# Patient Record
Sex: Female | Born: 1995 | Hispanic: Yes | Marital: Married | Smoking: Never smoker
Health system: Southern US, Community
[De-identification: ages and names within clinical notes are randomized; demographics above are authoritative.]

## PROBLEM LIST (undated history)

## (undated) DIAGNOSIS — B2790 Infectious mononucleosis, unspecified without complication: Secondary | ICD-10-CM

## (undated) DIAGNOSIS — L309 Dermatitis, unspecified: Secondary | ICD-10-CM

## (undated) DIAGNOSIS — R21 Rash and other nonspecific skin eruption: Secondary | ICD-10-CM

## (undated) DIAGNOSIS — E282 Polycystic ovarian syndrome: Secondary | ICD-10-CM

## (undated) HISTORY — DX: Rash and other nonspecific skin eruption: R21

## (undated) HISTORY — DX: Infectious mononucleosis, unspecified without complication: B27.90

## (undated) HISTORY — DX: Dermatitis, unspecified: L30.9

## (undated) HISTORY — PX: WISDOM TOOTH EXTRACTION: SHX21

## (undated) HISTORY — DX: Polycystic ovarian syndrome: E28.2

---
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
## 2014-01-31 ENCOUNTER — Encounter: Payer: Self-pay | Admitting: Pediatrics

## 2014-01-31 ENCOUNTER — Ambulatory Visit (INDEPENDENT_AMBULATORY_CARE_PROVIDER_SITE_OTHER): Payer: Medicaid Other | Admitting: Pediatrics

## 2014-01-31 VITALS — BP 128/72 | Ht 59.61 in | Wt 197.8 lb

## 2014-01-31 DIAGNOSIS — Z113 Encounter for screening for infections with a predominantly sexual mode of transmission: Secondary | ICD-10-CM

## 2014-01-31 DIAGNOSIS — E282 Polycystic ovarian syndrome: Secondary | ICD-10-CM

## 2014-01-31 MED ORDER — NORETHINDRON-ETHINYL ESTRAD-FE 1-20/1-30/1-35 MG-MCG PO TABS: ORAL_TABLET | ORAL | Status: DC

## 2014-01-31 NOTE — Progress Notes (Signed)
Adolescent Medicine Consultation Follow-Up Visit Cynthia Waller  is a 18 y.o. female here today for follow-up of PCOS.   PCP Confirmed? Yes, Delorse LekMartha Perry, MD.   History was provided by the patient and mother.  Chart review:  Last seen by Dr. Marina GoodellPerry on 11/01/2013.  Treatment plan at last visit included an increase in metformin to 1000 mg po daily, she was started on spironolactone, and OCP (continuous cycling), and thyroid ultrasound was obtained notable for small cyst.   HPI:  Pt reports that she takes metformin "on and off".   She was on it for a month, but then endorsed stopping for one month, she reports that she has trouble remembering to take it.  She is also taking an OCP on a continuous cycle, and endorses taking this daily (however missed yesterday).  She did not start the Spironolactone.    Since las visit, she is now home for the summer, a typical day will include about 2 bagels for breakfast, rice, chicken and 4 tortillas for lunch, and the same for dinner.  As a family, they eat fast food ~1x/week ([employer]).  She drinks mostly water and sodas on the weekends.  She is not doing any physical activity.     She reports one of her major goals in management of her PCOS is weight loss.   She was concerned about hair growth in the beard distribution before, but reports this has improved and is not longer much concern for her.  She is not experiencing any acne.    Last A1C was 5.4 in 07/12/2013.    Patient's last menstrual period was 12/09/2013.  Lasted for 3 days and was heavy flow.   ROS  Constitutional. No fever. Eyes. Denies blurry vision or double vision.  HEENT. HA when outdoors resolve spontaneously or with sleep, currently experiencing some URI symptoms GI. No abdominal pain, no constipation.  GU. No dysuria, no hematuria  Neuro. No dizziness or weakness Endo. No heat or cold intolerance  Skin. No changes     Current Outpatient Prescriptions on File Prior to Visit   Medication Sig Dispense Refill  . cetirizine (ZYRTEC) 10 MG tablet Take 1 tablet (10 mg total) by mouth daily.  30 tablet  0  . metFORMIN (GLUCOPHAGE-XR) 500 MG 24 hr tablet Take 1 pill with dinner x 2 weeks, then 2 pills with dinner, then keep at 2 pills until f/u  60 tablet  2  . norethindrone-ethinyl estradiol-iron (ESTROSTEP FE,TILIA FE,TRI-LEGEST FE) 1-20/1-30/1-35 MG-MCG tablet Take 1 pill daily x 84 days without taking placebo.  Then take placebo with the last pack  4 Package  3  . spironolactone (ALDACTONE) 100 MG tablet Take 1/2 tablet po daily x 2 weeks, then 1 tablet po daily  30 tablet  2   No current facility-administered medications on file prior to visit.    No Known Allergies  Patient Active Problem List   Diagnosis Date Noted  . Goiter 11/01/2013  . Rash and nonspecific skin eruption 06/07/2013  . Polycystic ovarian syndrome 01/22/2013  . Morbid obesity 01/22/2013  . Acanthosis nigricans 01/22/2013    Social History: Sleep:  No concerns, sleeps 8 hours a night Eating Habits: as above.  Screen Time: >8 hrs a day.  Exercise: none. 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  She was living on campus.  She will do some summer classes this year at [employer].  She works at [employer].  Physical Exam:  Filed Vitals:   01/31/14 1550  BP: 128/72  Height: 4' 11.61" (1.514 m)  Weight: 197 lb 12.8 oz (89.721 kg)   BP 128/72  Ht 4' 11.61" (1.514 m)  Wt 197 lb 12.8 oz (89.721 kg)  BMI 39.14 kg/m2  LMP 12/09/2013 Body mass index: body mass index is 39.14 kg/(m^2). Blood pressure percentiles are 97% systolic and 77% diastolic based on 2000 NHANES data. Blood pressure percentile targets: 90: 121/78, 95: 125/82, 99: 137/94.  Physical Examination: General appearance - alert, pleasant, and in no distress Eyes - pupils equal and reactive, extraocular eye movements intact Nose - normal and patent, no  erythema, discharge or polyps Mouth - mucous membranes moist, pharynx normal without lesions Neck - supple, minimal anterior cervical LAN, mild thyromegaly.  Chest - clear to auscultation, no wheezes, rales or rhonchi Heart - RRR, nml S1S2, no murmur appreciated.  Abdomen - soft, normoactive bowel sounds Neurological - alert and oriented, no gross deficits  Extremities - warm and well perfused, no edema   Skin - acanthosis nigricans neck.    Thyroid Ultrasound IMPRESSION:  Normal-sized thyroid with a single small left cyst or nodule.  Findings do not meet current consensus criteria for biopsy.  Follow-up by clinical exam is recommended. If patient has known risk  factors for thyroid carcinoma, consider follow-up ultrasound in 12  months. If patient is clinically hyperthyroid, consider nuclear  medicine thyroid uptake and scan. This recommendation follows the  consensus statement: Management of Thyroid Nodules Detected as US:  Society of Radiologists in Ultrasound Consensus Conference  Statement. Radiology 2005; [id number].   Assessment:  Cynthia Waller is a an 18 year old female with PCOS here for follow up.  Discussed goals of weight loss and interventions to help make this goal possible, encouraged pt to start taking metformin regularly as well as work on attainable goals for more healthy lifestyle.    Plan: -pt to work on goal of 2 servings of fruit a day.  She will email in one month with progress, and will choose new goal at that time.  -reviewed thyroid US with patient. -STD screening: GC/Chlamydia urine.  -Norethindrone-ethinyl estradiol-iron (ESTROSTEP FE,TILIA FE,TRI-LEGEST FE) 1-20/1-30/1-35 MG-MCG tablet; Take 1 pill daily x 84 days without taking placebo.  Then take placebo with the last pack  Dispense: 4 Package; Refill: 3   Follow-up:  3 months   Medical decision-making:  > 25 minutes spent, more than 50% of appointment was spent discussing diagnosis and management of  symptoms  Keith RakeAshley Orval Dortch, MD [facility], PGY-2 01/31/2014 5:08 PM

## 2014-01-31 NOTE — Patient Instructions (Addendum)
Set a phone alarm to take the metformin.    Get a pill box with days of the week.  Also get an extra pill container to put in your purse to take when you're out.   Your goal is to eat 2 servings of fruit a day.   Email in one month to let us know the progress of your fruit intake goal, then we can come up with a new goal.

## 2014-02-01 LAB — GC/CHLAMYDIA PROBE AMP, URINE
Chlamydia, Swab/Urine, PCR: NEGATIVE
GC PROBE AMP, URINE: NEGATIVE

## 2014-02-04 NOTE — Progress Notes (Signed)
Attending Physician Co-Signature  I saw and evaluated the patient, performing the key elements of the service.  I developed  the management plan that is described in the resident's note, and I agree with the content.  Of note, patient is on estrostep which has varying hormone doses.  If she has any BTB or issues with that I recommend switching to a monophasic OCP.  Cain SievePERRY, MARTHA FAIRBANKS, MD

## 2014-05-02 ENCOUNTER — Ambulatory Visit (INDEPENDENT_AMBULATORY_CARE_PROVIDER_SITE_OTHER): Payer: Medicaid Other | Admitting: Pediatrics

## 2014-05-02 ENCOUNTER — Encounter: Payer: Self-pay | Admitting: Pediatrics

## 2014-05-02 VITALS — BP 120/78 | Wt 197.0 lb

## 2014-05-02 DIAGNOSIS — L83 Acanthosis nigricans: Secondary | ICD-10-CM

## 2014-05-02 DIAGNOSIS — R6889 Other general symptoms and signs: Secondary | ICD-10-CM

## 2014-05-02 DIAGNOSIS — E049 Nontoxic goiter, unspecified: Secondary | ICD-10-CM

## 2014-05-02 DIAGNOSIS — E282 Polycystic ovarian syndrome: Secondary | ICD-10-CM

## 2014-05-02 LAB — TSH: TSH: 2.41 u[IU]/mL (ref 0.350–4.500)

## 2014-05-02 LAB — T4, FREE: Free T4: 1.02 ng/dL (ref 0.80–1.80)

## 2014-05-02 MED ORDER — NORGESTIMATE-ETH ESTRADIOL 0.25-35 MG-MCG PO TABS: ORAL_TABLET | ORAL | Status: DC

## 2014-05-02 NOTE — Progress Notes (Signed)
Adolescent Medicine Consultation Follow-Up Visit Cynthia Waller  is a 18 y.o. female here today for follow-up of PCOS.   PCP Confirmed?  yes  PERRY, Bosie Clos, MD   History was provided by the patient.  Chart review:  Last seen by Dr. Marina Goodell on 01/31/14.  Treatment plan at last visit included working on increasing fruits, reviewed thyroid ultrasound results, discussed restarting metformin and continue the OCP.   Last STI screen:  Component     Latest Ref Rng 01/31/2014  Chlamydia, Swab/Urine, PCR     NEGATIVE NEGATIVE  GC Probe Amp, Urine     NEGATIVE NEGATIVE   Pertinent Labs: None Previous Pysch Screenings: None Immunizations: UTD  Psych Screenings completed for today's visit: None  HPI:  Pt reports she has increased her fruit intake.  Also stopped going back for seconds.  Has started exercising as well, was doing 20 minutes of walking and recently started running with a trainer, sometimes lifts weight, once this week, last week was daily.  Has not restarted metformin.  Has had some menstrual irregularity with continuous cycling.    Hair growth: None Acne: None Acanthosis nigricans: Still present unchanged Weight management:  Wt Readings from Last 3 Encounters:  05/02/14 197 lb (89.359 kg) (97%*, Z = 1.91)  01/31/14 197 lb 12.8 oz (89.721 kg) (97%*, Z = 1.93)  11/01/13 198 lb (89.812 kg) (97%*, Z = 1.94)   * Growth percentiles are based on CDC 2-20 Years data.   Patient's last menstrual period was 04/15/2014.  ROS per HPI  The following portions of the patient's history were reviewed and updated as appropriate: allergies, current medications and problem list.  No Known Allergies  Social History: Confidentiality was discussed with the patient and if applicable, with caregiver as well.  Tobacco? no Secondhand smoke exposure?no Drugs/EtOH?no Sexually active?yes, same partner Pregnancy Prevention: OCP Safe at home, in school & in relationships?  Yes Safe to self? Yes Guns in the home? no  Physical Exam:  Filed Vitals:   05/02/14 1538  BP: 120/78  Weight: 197 lb (89.359 kg)   BP 120/78  Wt 197 lb (89.359 kg)  LMP 04/15/2014 Body mass index: body mass index is 38.98 kg/(m^2). No height on file for this encounter.  Physical Exam  Constitutional: No distress.  Neck: Thyromegaly present.  Cardiovascular: Normal rate and regular rhythm.   No murmur heard. Pulmonary/Chest: Breath sounds normal.  Abdominal: Soft. There is no tenderness. There is no guarding.  Lymphadenopathy:    She has no cervical adenopathy.  Skin:  Acanthosis nigricans    Assessment/Plan: 1. Polycystic ovarian syndrome Her symptoms consist of menstrual irregularity with comorbidities of acanthosis nigricans and obesity.  Continuous cycling with estrostep has produced some BTB and thus will switch to monophasic for better results. - norgestimate-ethinyl estradiol (SPRINTEC 28) 0.25-35 MG-MCG tablet; Take 1 tablet daily for first 3 rows of pills, skip placebos.  Repeat until 4th pack of pills and then take placebos at the  Dispense: 4 Package; Refill: 3 PCOS Labs & Referrals:   - CMP annually:  Due 06/2014 - CBC annually if normal, as needed if abnormal:  Due 06/2014 - Vit D once and then as needed if abnormal:  Due 06/2014 - Lipid annually if abnormal, every 2 years if normal:  Due 06/2014 - Hgba1c every 3 months if abnormal, every year if normal:  Due 06/2014 - Nutrition referral: Has seen her in the past and does not feel it will help to go back -  Will also obtain TSH/FT4 with blood tests 06/2014  2. Acanthosis nigricans 3. Morbid obesity Her weight is essentially stable.  She has recently increased her exercise.  She declines further nutritional counseling.  She does not want to take metformin at this time. Advised we will recheck her hgba1c soon and if that shows prediabetes or diabetes we will want to reconsider metformin.  Discussed also the AN is  a sign that there is insulin resistance. Reviewed strategies for healthy eating and encouraged patient to continue exercising.  4. Goiter 5. Cold intolerance Her thyroid ultrasound showed 1 nodule.  Her goiter continues to be impressive.  Will check TFTs and antibody today.  Consider repeat ultrasound by or before 12/2013 and referral to adult endo. - TSH - T4, free - Thyroid peroxidase antibody  Follow-up:  3 months  Medical decision-making:  > 25 minutes spent, more than 50% of appointment was spent discussing diagnosis and management of symptoms

## 2014-05-03 LAB — THYROID PEROXIDASE ANTIBODY: Thyroperoxidase Ab SerPl-aCnc: 2 IU/mL (ref <9)

## 2014-05-07 NOTE — Progress Notes (Signed)
Quick Note:  Please notify patient/caregiver that the recent lab results were normal. We can discuss the results further at future follow-up visits. Please remind patient of any upcoming appointments.  ______ 

## 2014-05-09 ENCOUNTER — Telehealth: Payer: Self-pay

## 2014-05-09 NOTE — Telephone Encounter (Signed)
Message copied by Ovidio Hanger on Fri May 09, 2014  2:16 PM ------      Message from: Owens Shark      Created: Wed May 07, 2014 11:13 AM       Please notify patient/caregiver that the recent lab results were normal.  We can discuss the results further at future follow-up visits.  Please remind patient of any upcoming appointments.       ------

## 2014-05-09 NOTE — Progress Notes (Signed)
Done

## 2014-05-09 NOTE — Telephone Encounter (Signed)
Called and advised mom of normal labs.  She verbalized understanding.  She did question if cholesterol was checked and I told her that it was not.

## 2014-08-01 ENCOUNTER — Ambulatory Visit: Payer: Medicaid Other | Admitting: Pediatrics

## 2014-09-19 ENCOUNTER — Ambulatory Visit (INDEPENDENT_AMBULATORY_CARE_PROVIDER_SITE_OTHER): Payer: Medicaid Other | Admitting: Pediatrics

## 2014-09-19 ENCOUNTER — Encounter: Payer: Self-pay | Admitting: Pediatrics

## 2014-09-19 VITALS — BP 126/74 | Ht 59.5 in | Wt 192.8 lb

## 2014-09-19 DIAGNOSIS — Z23 Encounter for immunization: Secondary | ICD-10-CM

## 2014-09-19 DIAGNOSIS — E282 Polycystic ovarian syndrome: Secondary | ICD-10-CM

## 2014-09-19 DIAGNOSIS — E049 Nontoxic goiter, unspecified: Secondary | ICD-10-CM

## 2014-09-19 MED ORDER — NORGESTIMATE-ETH ESTRADIOL 0.25-35 MG-MCG PO TABS: ORAL_TABLET | ORAL | Status: DC

## 2014-09-19 NOTE — Progress Notes (Signed)
Attending Co-Signature.  I saw and evaluated the patient, performing the key elements of the service.  I developed the management plan that is described in the resident's note, and I agree with the content.  19 yo female here for f/u on PCOS.  Made some positive lifestyle changes but then has decreased in following those recently.  No longer having BTB, improved on Sprintec.  No acne.  No change in hair growth, may be somewhat improved.  Due for labs today.    Wt Readings from Last 3 Encounters:  09/19/14 192 lb 12.8 oz (87.454 kg) (97 %*, Z = 1.84)  05/02/14 197 lb (89.359 kg) (97 %*, Z = 1.91)  01/31/14 197 lb 12.8 oz (89.721 kg) (97 %*, Z = 1.93)   * Growth percentiles are based on CDC 2-20 Years data.    Cain SievePERRY, Azrael Maddix FAIRBANKS, MD Adolescent Medicine Specialist

## 2014-09-19 NOTE — Progress Notes (Signed)
Adolescent Medicine Consultation Follow-Up Visit Cynthia Waller  is a 19 y.o. female here today for follow-up of PCOS.   PCP Confirmed?  yes  Hacker,Caroline T, FNP   History was provided by the patient.  Chart review:  Last seen by Dr. Marina GoodellPerry on 05/02/14.  Treatment plan at last visit included changing from estrostep to Sprintec (monophasic OCP) for concerns of BTB, continue regular excercise, review TFTs (normal), obtain thyroid US in 12/2013 and refer to adult endo if indicated.     Last STI screen:  Component     Latest Ref Rng 01/31/2014  Chlamydia, Swab/Urine, PCR     NEGATIVE NEGATIVE  GC Probe Amp, Urine     NEGATIVE NEGATIVE   Pertinent Labs: TFTs normal 05/02/2014 - TSH 2.4, fT4 1.02, Thyroid peroxidase antibody 2 Previous Pysch Screenings: None Immunizations: UTD  Psych Screenings completed for today's visit: None  HPI:  Pt reports that she was exercising twice weekly with friend (jogging/walking 5 minutes, toning exercises for ~40 minutes), avoiding pizza (previously staple of her diet), eating more fruit. Positive changes stopped in December and pt no longer exercises. She eats mostly sandwiches, cereal and take-out (pizza and fast food).   Menses: Pt switched to sprintec. No BTB. She occasionally forgets to take the pill. Otherwise, denies menstrual irregularity. Acne: None Hair growth: No increased facial/body hair growth. Minimal on chin - hair removal twice per week.   Acanthosis nigricans: Still present unchanged  Weight management:  Wt Readings from Last 3 Encounters:  09/19/14 192 lb 12.8 oz (87.454 kg) (97 %*, Z = 1.84)  05/02/14 197 lb (89.359 kg) (97 %*, Z = 1.91)  01/31/14 197 lb 12.8 oz (89.721 kg) (97 %*, Z = 1.93)   * Growth percentiles are based on CDC 2-20 Years data.   Patient's last menstrual period was 09/18/2014.  ROS per HPI  The following portions of the patient's history were reviewed and updated as appropriate: allergies, current  medications and problem list.  Allergies  Allergen Reactions  . Penicillins Swelling and Rash    Social History: Confidentiality was discussed with the patient and if applicable, with caregiver as well.  Tobacco? no Secondhand smoke exposure?no Drugs/EtOH?no Sexually active?yes, same partner Pregnancy Prevention: OCP Safe at home, in school & in relationships? Yes Safe to self? Yes Guns in the home? no  Physical Exam:  Filed Vitals:   09/19/14 1551  BP: 126/74  Height: 4' 11.5" (1.511 m)  Weight: 192 lb 12.8 oz (87.454 kg)   BP 126/74 mmHg  Ht 4' 11.5" (1.511 m)  Wt 192 lb 12.8 oz (87.454 kg)  BMI 38.30 kg/m2  LMP 09/18/2014 Body mass index: body mass index is 38.3 kg/(m^2). Blood pressure percentiles are 97% systolic and 85% diastolic based on 2000 NHANES data. Blood pressure percentile targets: 90: 120/77, 95: 124/81, 99 + 5 mmHg: 136/93.  Physical Exam  Constitutional: No distress.  Neck: Thyromegaly present.  Cardiovascular: Normal rate and regular rhythm.   No murmur heard. Pulmonary/Chest: Breath sounds normal.  Abdominal: Soft. There is no tenderness. There is no guarding.  Lymphadenopathy:    She has no cervical adenopathy.  Skin:  Acanthosis nigricans    Assessment/Plan: 1. Polycystic ovarian syndrome Her symptoms consist of menstrual irregularity (resolved with OCPs) with comorbidities of acanthosis nigricans and obesity.  Continuous cycling with estrostep produced some BTB that resolved following switch to monophasic. - Continue norgestimate-ethinyl estradiol (SPRINTEC 28) 0.25-35 MG-MCG tablet; Take 1 tablet daily for first 3 rows of  pills, skip placebos.  Repeat until 4th pack of pills and then take placebos. Medication refilled today.  Obtain PCOS Labs today:   - CMP annually:  Due 06/2014 - CBC annually if normal, as needed if abnormal:  Due 06/2014 - Vit D once and then as needed if abnormal:  Due 06/2014 - Lipid annually if abnormal, every 2  years if normal:  Due 06/2014 - Hgba1c every 3 months if abnormal, every year if normal:  Due 06/2014 - Nutrition referral: Has seen her in the past. Agrees to see nutritionist on days that she is seen in [facility]; otherwise voices difficulty "fitting it in" her schedule.   2. Acanthosis nigricans 3. Morbid obesity Her weight has decreased by ~4lbs since 04/2014.  She plans to increase her exercise.  And is willing (although seems somewhat hesitant) to obtain further nutritional counseling.  She does not want to take metformin at this time. Will recheck her hgba1c today and if that shows prediabetes or diabetes we will want to reconsider metformin.  Reviewed strategies for healthy eating (including recommendations on [email or web address]) and encouraged patient to continue exercising.  4. Goiter 5. Cold intolerance Previous thyroid ultrasound showed 1 nodule.  Her goiter continues to be impressive.  TFTs and antibody were wnl 04/2014. Will repeat ultrasound and referral to adult endo if indicated. - TSH - T4, free - Thyroid peroxidase antibody - Thyroid ultrasound  Follow-up:  3 months  Medical decision-making:  > 25 minutes spent, more than 50% of appointment was spent discussing diagnosis and management of symptoms

## 2014-09-19 NOTE — Patient Instructions (Signed)
-   The following website has great options for selecting healthy foods in a dormitory: [email or web address]

## 2014-09-20 LAB — HEMOGLOBIN A1C
Hgb A1c MFr Bld: 5.4 % (ref <5.7)
Mean Plasma Glucose: 108 mg/dL (ref <117)

## 2014-09-20 LAB — COMPREHENSIVE METABOLIC PANEL
ALK PHOS: 63 U/L (ref 39–117)
ALT: 10 U/L (ref 0–35)
AST: 12 U/L (ref 0–37)
Albumin: 3.9 g/dL (ref 3.5–5.2)
BILIRUBIN TOTAL: 0.2 mg/dL (ref 0.2–1.1)
BUN: 7 mg/dL (ref 6–23)
CHLORIDE: 101 meq/L (ref 96–112)
CO2: 25 mEq/L (ref 19–32)
Calcium: 9.2 mg/dL (ref 8.4–10.5)
Creat: 0.56 mg/dL (ref 0.50–1.10)
GLUCOSE: 92 mg/dL (ref 70–99)
Potassium: 4.3 mEq/L (ref 3.5–5.3)
Sodium: 138 mEq/L (ref 135–145)
Total Protein: 6.5 g/dL (ref 6.0–8.3)

## 2014-09-20 LAB — TSH: TSH: 1.533 u[IU]/mL (ref 0.350–4.500)

## 2014-09-20 LAB — LIPID PANEL
CHOL/HDL RATIO: 4.2 ratio
Cholesterol: 193 mg/dL (ref 0–200)
HDL: 46 mg/dL (ref >39)
LDL Cholesterol: 83 mg/dL (ref 0–99)
Triglycerides: 319 mg/dL — ABNORMAL HIGH (ref <150)
VLDL: 64 mg/dL — ABNORMAL HIGH (ref 0–40)

## 2014-09-20 LAB — T4, FREE: FREE T4: 1.06 ng/dL (ref 0.80–1.80)

## 2014-09-20 LAB — THYROID PEROXIDASE ANTIBODY: Thyroperoxidase Ab SerPl-aCnc: 2 IU/mL (ref <9)

## 2014-09-20 LAB — VITAMIN D 25 HYDROXY (VIT D DEFICIENCY, FRACTURES): Vit D, 25-Hydroxy: 14 ng/mL — ABNORMAL LOW (ref 30–100)

## 2014-10-01 ENCOUNTER — Telehealth: Payer: Self-pay

## 2014-10-01 NOTE — Telephone Encounter (Signed)
Called pt and [facility]t for Ul[facility]ound. Pt voiced understanding and read back number for facility to call.

## 2014-12-26 ENCOUNTER — Telehealth: Payer: Self-pay | Admitting: *Deleted

## 2014-12-26 NOTE — Telephone Encounter (Signed)
Pt called and informed that her insurance has again given authorization for her scheduled US. Remind[facility] pt of [facility], as well as [facility]. Pt verbalized understanding, aware of both appointments, plans to come.

## 2014-12-29 ENCOUNTER — Ambulatory Visit (HOSPITAL_COMMUNITY)
Admission: RE | Admit: 2014-12-29 | Discharge: 2014-12-29 | Disposition: A | Discharge status: Home / Self Care | Payer: Medicaid Other | Source: Ambulatory Visit | Attending: Pediatrics | Admitting: Pediatrics

## 2014-12-29 DIAGNOSIS — E049 Nontoxic goiter, unspecified: Secondary | ICD-10-CM

## 2014-12-29 DIAGNOSIS — E041 Nontoxic single thyroid nodule: Secondary | ICD-10-CM | POA: Insufficient documentation

## 2015-01-02 ENCOUNTER — Ambulatory Visit (INDEPENDENT_AMBULATORY_CARE_PROVIDER_SITE_OTHER): Payer: Medicaid Other | Admitting: Pediatrics

## 2015-01-02 ENCOUNTER — Encounter: Payer: Self-pay | Admitting: Pediatrics

## 2015-01-02 VITALS — BP 124/76 | HR 92 | Ht 59.5 in | Wt 194.8 lb

## 2015-01-02 DIAGNOSIS — E282 Polycystic ovarian syndrome: Secondary | ICD-10-CM

## 2015-01-02 DIAGNOSIS — E559 Vitamin D deficiency, unspecified: Secondary | ICD-10-CM

## 2015-01-02 MED ORDER — VITAMIN D (ERGOCALCIFEROL) 1.25 MG (50000 UNIT) PO CAPS: 50000.0000 [IU] | ORAL_CAPSULE | ORAL | Status: DC

## 2015-01-02 NOTE — Patient Instructions (Addendum)
Goal set:  Avoid fast food Consider Inositol for treatment of the PCOS D-chiro-inositol 1200 mg per day   Food Choices to Lower Your Triglycerides  Triglycerides are a type of fat in your blood. High levels of triglycerides can increase the risk of heart disease and stroke. If your triglyceride levels are high, the foods you eat and your eating habits are very important. Choosing the right foods can help lower your triglycerides.  WHAT GENERAL GUIDELINES DO I NEED TO FOLLOW?  Lose weight if you are overweight.   Limit or avoid alcohol.   Fill one half of your plate with vegetables and green salads.   Limit fruit to two servings a day. Choose fruit instead of juice.   Make one fourth of your plate whole grains. Look for the word "whole" as the first word in the ingredient list.  Fill one fourth of your plate with lean protein foods.  Enjoy fatty fish (such as salmon, mackerel, sardines, and tuna) three times a week.   Choose healthy fats.   Limit foods high in starch and sugar.  Eat more home-cooked food and less restaurant, buffet, and fast food.  Limit fried foods.  Cook foods using methods other than frying.  Limit saturated fats.  Check ingredient lists to avoid foods with partially hydrogenated oils (trans fats) in them. WHAT FOODS CAN I EAT?  Grains Whole grains, such as whole wheat or whole grain breads, crackers, cereals, and pasta. Unsweetened oatmeal, bulgur, barley, quinoa, or brown rice. Corn or whole wheat flour tortillas.  Vegetables Fresh or frozen vegetables (raw, steamed, roasted, or grilled). Green salads. Fruits All fresh, canned (in natural juice), or frozen fruits. Meat and Other Protein Products Ground beef (85% or leaner), grass-fed beef, or beef trimmed of fat. Skinless chicken or [place]. Ground chicken or [place]. Pork trimmed of fat. All fish and seafood. Eggs. Dried beans, peas, or lentils. Unsalted nuts or seeds. Unsalted canned or dry  beans. Dairy Low-fat dairy products, such as skim or 1% milk, 2% or reduced-fat cheeses, low-fat ricotta or cottage cheese, or plain low-fat yogurt. Fats and Oils Tub margarines without trans fats. Light or reduced-fat mayonnaise and salad dressings. Avocado. Safflower, olive, or canola oils. Natural peanut or almond butter. The items listed above may not be a complete list of recommended foods or beverages. Contact your dietitian for more options. WHAT FOODS ARE NOT RECOMMENDED?  Grains White bread. White pasta. White rice. Cornbread. Bagels, pastries, and croissants. Crackers that contain trans fat. Vegetables White potatoes. Corn. Creamed or fried vegetables. Vegetables in a cheese sauce. Fruits Dried fruits. Canned fruit in light or heavy syrup. Fruit juice. Meat and Other Protein Products Fatty cuts of meat. Ribs, chicken wings, bacon, sausage, bologna, salami, chitterlings, fatback, hot dogs, bratwurst, and packaged luncheon meats. Dairy Whole or 2% milk, cream, half-and-half, and cream cheese. Whole-fat or sweetened yogurt. Full-fat cheeses. Nondairy creamers and whipped toppings. Processed cheese, cheese spreads, or cheese curds. Sweets and Desserts Corn syrup, sugars, honey, and molasses. Candy. Jam and jelly. Syrup. Sweetened cereals. Cookies, pies, cakes, donuts, muffins, and ice cream. Fats and Oils Butter, stick margarine, lard, shortening, ghee, or bacon fat. Coconut, palm kernel, or palm oils. Beverages Alcohol. Sweetened drinks (such as sodas, lemonade, and fruit drinks or punches). The items listed above may not be a complete list of foods and beverages to avoid. Contact your dietitian for more information. Document Released: 05/26/2004 Document Revised: 08/13/2013 Document Reviewed: 06/12/2013 [facility] Patient Information 2015 [employer] [place].  This information is not intended to replace advice given to you by your health care provider. Make sure you discuss any questions  you have with your health care provider.

## 2015-01-02 NOTE — Progress Notes (Signed)
Pre-Visit Planning  Review of previous notes:  Cynthia Waller  is a 19 y.o. female referred by Verneda SkillHacker,Caroline T, FNP.   Last seen in [facility] on 09/19/2014 for PCOS.  Treatment plan at last visit included change to sprintec to improve BTB, continue lifestyle changes, obtain thyroid ultrasound.   Previous Psych Screenings?  no Psych Screenings Due? Yes, PHQ-SADs  STI screen in the past year? yes Pertinent Labs? yes Component     Latest Ref Rng 09/19/2014  Sodium     135 - 145 mEq/L 138  Potassium     3.5 - 5.3 mEq/L 4.3  Chloride     96 - 112 mEq/L 101  CO2     19 - 32 mEq/L 25  Glucose     70 - 99 mg/dL 92  BUN     6 - 23 mg/dL 7  Creatinine     1.610.50 - 1.10 mg/dL 0.960.56  Total Bilirubin     0.2 - 1.1 mg/dL 0.2  Alkaline Phosphatase     39 - 117 U/L 63  AST     0 - 37 U/L 12  ALT     0 - 35 U/L 10  Total Protein     6.0 - 8.3 g/dL 6.5  Albumin     3.5 - 5.2 g/dL 3.9  Calcium     8.4 - 10.5 mg/dL 9.2  Cholesterol     0 - 200 mg/dL 045193  Triglycerides     <150 mg/dL 409319 (H)  HDL Cholesterol     >39 mg/dL 46  Total CHOL/HDL Ratio      4.2  VLDL     0 - 40 mg/dL 64 (H)  LDL (calc)     0 - 99 mg/dL 83  Hemoglobin W1XA1C     <5.7 % 5.4  Mean Plasma Glucose     <117 mg/dL 914108  Vit D, 78-GNFAOZH25-Hydroxy     30 - 100 ng/mL 14 (L)  TSH     0.350 - 4.500 uIU/mL 1.533  Free T4     0.80 - 1.80 ng/dL 0.861.06  Thyroperoxidase Ab SerPl-aCnc     <9 IU/mL 2   Clinical Staff Visit Tasks:   - Psych Screens as above  Provider Visit Tasks: - Assess PCOS symptoms - Review med benefits and side effects - Review low vitamin D - Review elevated triglycerides  Adolescent Medicine Consultation Follow-Up Visit Cynthia Waller  is a 19 y.o. female referred by Verneda SkillHacker, Caroline T, FNP here today for follow-up of PCOS.    Previsit planning completed:  yes  Growth Chart Viewed? not applicable   History was provided by the patient and mother.  PCP  Confirmed?  yes  HPI:  Likes new birth control pill, no side effects Reviewed labs and recommended vitamin D Periods are regular when she takes OCP consistently  Minimal acne Hirsutism still present but not as much  Not exercising currently Eating habits similar, started snacking at night and feels this has lead to weight gain  In addition, reviewed thyroid ultrasound results.  Nodule stable.  Could consider FNA in future.  Patient's last menstrual period was 12/11/2014 (approximate).  The following portions of the patient's history were reviewed and updated as appropriate: allergies, current medications, past social history and problem list.  Allergies  Allergen Reactions  . Penicillins Swelling and Rash  . Amoxicillin Swelling and Rash    Social History: Confidentiality was discussed with the patient and if applicable,  with caregiver as well.  Tobacco?  yes, e-cigs once daily Secondhand smoke exposure?  yes Drugs/ETOH?  yes, once month  Partner preference?  female Sexually Active?  yes   Pregnancy Prevention:  condoms and birth control pills, reviewed condoms & plan B Safe at home, in school & in relationships?  Yes Safe to self?  Yes   Physical Exam:  Filed Vitals:   01/02/15 1513 01/02/15 1604  BP: 130/76 124/76  Pulse: 92   Height: 4' 11.5" (1.511 m)   Weight: 194 lb 12.8 oz (88.361 kg)    BP 124/76 mmHg  Pulse 92  Ht 4' 11.5" (1.511 m)  Wt 194 lb 12.8 oz (88.361 kg)  BMI 38.70 kg/m2  LMP 12/11/2014 (Approximate) Body mass index: body mass index is 38.7 kg/(m^2). Blood pressure percentiles are 96% systolic and 90% diastolic based on 2000 NHANES data. Blood pressure percentile targets: 90: 119/76, 95: 123/80, 99 + 5 mmHg: 136/93.  Physical Exam  Constitutional: No distress.  Neck: No thyromegaly present.  Cardiovascular: Normal rate and regular rhythm.   No murmur heard. Pulmonary/Chest: Breath sounds normal.  Abdominal: Soft. There is no tenderness. There  is no guarding.  Musculoskeletal: She exhibits no edema.  Lymphadenopathy:    She has no cervical adenopathy.  Skin:  Hair growth on cheeks and chin, acanthosis nigricans on nape of neck  Nursing note and vitals reviewed.  PHQ-SADS Completed on: 01/02/2015 PHQ-15:  2 GAD-7:  0 PHQ-9:  1 Reported problems make it not at all difficult to complete activities of daily functioning.    Assessment/Plan: 1. Polycystic ovarian syndrome 2. Morbid obesity Continue work on healthy lifestyle changes.  Pt declines metformin.  PCOS Labs & Referrals:   - Hgba1c annually if normal, every 3 months if abnormal:  Due 08/2015 - CMP annually if normal, as needed if abnormal:  Due 08/2015 - CBC annually if normal, as needed if abnormal:  Due if metformin is started - Lipid every 2 years if normal, annually if abnormal:  Due 08/2015.Should be fasting. - Vitamin D annually if normal, as needed if abnormal: Due after vitamin D supps taken - Nutrition referral: Last seen 2014 - BH Screening: Due now    3. Vitamin D deficiency - Vitamin D, Ergocalciferol, (DRISDOL) 50000 UNITS CAPS capsule; Take 1 capsule (50,000 Units total) by mouth every 7 (seven) days.  Dispense: 8 capsule; Refill: 0  - Recheck at next visit  Follow-up:  Return in about 3 months (around 04/04/2015) for PCOS, with either Dr. Marina GoodellPerry or Rayfield Citizenaroline.   Medical decision-making:  > 15 minutes spent, more than 50% of appointment was spent discussing diagnosis and management of symptoms

## 2015-01-02 NOTE — Progress Notes (Signed)
Pre-Visit Planning  Review of previous notes:  Cynthia Waller  is a 19 y.o. female referred by Verneda SkillHacker,Caroline T, FNP.   Last seen in [facility] on 09/19/2014 for PCOS.  Treatment plan at last visit included change to sprintec to improve BTB, continue lifestyle changes, obtain thyroid ultrasound.   Previous Psych Screenings?  no Psych Screenings Due? Yes, PHQ-SADs  STI screen in the past year? yes Pertinent Labs? yes Component     Latest Ref Rng 09/19/2014  Sodium     135 - 145 mEq/L 138  Potassium     3.5 - 5.3 mEq/L 4.3  Chloride     96 - 112 mEq/L 101  CO2     19 - 32 mEq/L 25  Glucose     70 - 99 mg/dL 92  BUN     6 - 23 mg/dL 7  Creatinine     1.610.50 - 1.10 mg/dL 0.960.56  Total Bilirubin     0.2 - 1.1 mg/dL 0.2  Alkaline Phosphatase     39 - 117 U/L 63  AST     0 - 37 U/L 12  ALT     0 - 35 U/L 10  Total Protein     6.0 - 8.3 g/dL 6.5  Albumin     3.5 - 5.2 g/dL 3.9  Calcium     8.4 - 10.5 mg/dL 9.2  Cholesterol     0 - 200 mg/dL 045193  Triglycerides     <150 mg/dL 409319 (H)  HDL Cholesterol     >39 mg/dL 46  Total CHOL/HDL Ratio      4.2  VLDL     0 - 40 mg/dL 64 (H)  LDL (calc)     0 - 99 mg/dL 83  Hemoglobin W1XA1C     <5.7 % 5.4  Mean Plasma Glucose     <117 mg/dL 914108  Vit D, 78-GNFAOZH25-Hydroxy     30 - 100 ng/mL 14 (L)  TSH     0.350 - 4.500 uIU/mL 1.533  Free T4     0.80 - 1.80 ng/dL 0.861.06  Thyroperoxidase Ab SerPl-aCnc     <9 IU/mL 2   Clinical Staff Visit Tasks:   - Psych Screens as above  Provider Visit Tasks: - Assess PCOS symptoms - Review med benefits and side effects - Review low vitamin D - Review elevated triglycerides  PCOS Labs & Referrals:   - Hgba1c annually if normal, every 3 months if abnormal:  Due 08/2015 - CMP annually if normal, as needed if abnormal:  Due 08/2015 - CBC annually if normal, as needed if abnormal:  Due if metformin is started - Lipid every 2 years if normal, annually if abnormal:  Due 08/2015 -  Vitamin D annually if normal, as needed if abnormal: Due after vitamin D supps taken - Nutrition referral: Last seen 2014 - BH Screening: Due now

## 2015-01-15 DIAGNOSIS — E559 Vitamin D deficiency, unspecified: Secondary | ICD-10-CM | POA: Insufficient documentation

## 2015-04-03 ENCOUNTER — Ambulatory Visit: Payer: Medicaid Other | Admitting: Pediatrics

## 2015-05-15 ENCOUNTER — Ambulatory Visit: Payer: Medicaid Other | Admitting: Pediatrics

## 2015-05-27 ENCOUNTER — Encounter: Payer: Self-pay | Admitting: Pediatrics

## 2015-05-27 ENCOUNTER — Ambulatory Visit (INDEPENDENT_AMBULATORY_CARE_PROVIDER_SITE_OTHER): Payer: Medicaid Other | Admitting: Pediatrics

## 2015-05-27 VITALS — BP 131/78 | HR 97 | Ht 59.25 in | Wt 191.8 lb

## 2015-05-27 DIAGNOSIS — E049 Nontoxic goiter, unspecified: Secondary | ICD-10-CM

## 2015-05-27 DIAGNOSIS — E282 Polycystic ovarian syndrome: Secondary | ICD-10-CM | POA: Diagnosis not present

## 2015-05-27 DIAGNOSIS — M545 Low back pain, unspecified: Secondary | ICD-10-CM

## 2015-05-27 DIAGNOSIS — Z3202 Encounter for pregnancy test, result negative: Secondary | ICD-10-CM | POA: Diagnosis not present

## 2015-05-27 DIAGNOSIS — E559 Vitamin D deficiency, unspecified: Secondary | ICD-10-CM | POA: Diagnosis not present

## 2015-05-27 DIAGNOSIS — Z113 Encounter for screening for infections with a predominantly sexual mode of transmission: Secondary | ICD-10-CM

## 2015-05-27 LAB — POCT URINE PREGNANCY: Preg Test, Ur: NEGATIVE

## 2015-05-27 NOTE — Progress Notes (Signed)
THIS RECORD MAY CONTAIN CONFIDENTIAL INFORMATION THAT SHOULD NOT BE RELEASED WITHOUT REVIEW OF THE SERVICE PROVIDER.  Adolescent Medicine Consultation Follow-Up Visit Cynthia Waller  is a 19 y.o. female referred by Verneda Skill, FNP here today for follow-up.    Growth Chart Viewed? yes   History was provided by the patient and mother.  PCP Confirmed?  yes  My Chart Activated?   yes   Previsit planning completed:  yes Pre-Visit Planning  Cynthia Waller  is a 19 y.o. female referred by Verneda Skill, FNP.   Last seen in [facility] on 01/02/2015 for PCOS, obesity and vitamin D deficiency. Pt also has goiter and h/o thyroid cyst.   Previous Psych Screenings?  yes,  PHQ-SADS Completed on: 01/02/2015 PHQ-15: 2 GAD-7: 0 PHQ-9: 1 Reported problems make it not at all difficult to complete activities of daily functioning.   Treatment plan at last visit included healthy lifestyle changes, pt declined metformin, .   Clinical Staff Visit Tasks:   - Urine GC/CT due? yes - Psych Screenings Due? no  Provider Visit Tasks: - Assess PCOS symptoms - Assess goiter and thyroid symptoms - Discussed vitamin D deficiency and confirm supps taken - Pertinent Labs? no  PCOS Labs & Referrals:  - Hgba1c annually if normal, every 3 months if abnormal: Due 08/2015 - CMP annually if normal, as needed if abnormal: Due 08/2015 - CBC annually if normal, as needed if abnormal: Due if metformin is started - Lipid every 2 years if normal, annually if abnormal: Due 08/2015.Should be fasting. - Vitamin D annually if normal, as needed if abnormal: Due after vitamin D supps taken - Nutrition referral: Last seen 2014 - BH Screening: Due 12/2015  HPI:    Has been really busy.  Forgot to take any medications including birth control.  Has supplies at home to be able to restart.  No period for a few months.  No concerns today.  Still having hair growth on her face,  not better or worse.  Eating and exercise has been challenging, not interested in discussing today.  Reviewed repeat thyroid ultrasound indicated in May 2017.  Has low back pain, no radicular pain, no paresthesias, no leg weakness Worsened about 1 month ago.  No heavy lifting.  Sitting a lot more with work and for school.  Patient's last menstrual period was 04/08/2015 (approximate). Allergies  Allergen Reactions  . Penicillins Swelling and Rash  . Amoxicillin Swelling and Rash   Current Outpatient Prescriptions on File Prior to Visit  Medication Sig Dispense Refill  . norgestimate-ethinyl estradiol (SPRINTEC 28) 0.25-35 MG-MCG tablet Take 1 tablet daily for first 3 rows of pills, skip placebos.  Repeat until 4th pack of pills and then take placebos at the 4 Package 3  . Vitamin D, Ergocalciferol, (DRISDOL) 50000 UNITS CAPS capsule Take 1 capsule (50,000 Units total) by mouth every 7 (seven) days. 8 capsule 0   No current facility-administered medications on file prior to visit.    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  Tobacco?  yes, e-cig for stress management Drugs/ETOH?  Occasional EtOH Partner preference?  female Sexually Active?  yes   Pregnancy Prevention:  none, reviewed condoms & plan B Safe at home, in school & in relationships?  Yes Safe to self?  Yes   The following portions of the  patient's history were reviewed and updated as appropriate: allergies, current medications, past family history, past social history and problem list.  Physical Exam:  Filed Vitals:   05/27/15 1452  BP: 131/78  Pulse: 97  Height: 4' 11.25" (1.505 m)  Weight: 191 lb 12.8 oz (87 kg)   BP 131/78 mmHg  Pulse 97  Ht 4' 11.25" (1.505 m)  Wt 191 lb 12.8 oz (87 kg)  BMI 38.41 kg/m2  LMP 04/08/2015 (Approximate) Body mass index: body mass  index is 38.41 kg/(m^2). Blood pressure percentiles are 99% systolic and 94% diastolic based on 2000 NHANES data. Blood pressure percentile targets: 90: 119/75, 95: 122/79, 99 + 5 mmHg: 135/92.  Physical Exam  Constitutional: No distress.  Neck: No thyromegaly present.  Cardiovascular: Normal rate and regular rhythm.   No murmur heard. Pulmonary/Chest: Breath sounds normal.  Abdominal: Soft. There is no tenderness. There is no guarding.  Musculoskeletal: She exhibits no edema.  Pain lateralizes to left low back pain with hyperextension, strength intact bil, DTRs symmetric  Lymphadenopathy:    She has no cervical adenopathy.  Neurological: She is alert.  No tremor  Skin:  Acanthosis nigricans on neck Hirsutism on cheeks  Nursing note and vitals reviewed.   Assessment/Plan: 1. Polycystic ovarian syndrome Oligomenorrhea has recurred due to stopping OCPs.  Pt continues to have signs of hirsutism and insulin resistance. Discussed the seriousness of her health with signs of insulin resistance, borderline elevated blood pressure and importance of healthy lifestyle to prevent long term.  Discussed importance of considering medication management and more significant lifestyle changes to ensure future good health.  2. Goiter Repeat neck ultrasound May 2017.    3. Left-sided low back pain without sciatica Discussed likely a MSK issue.  However, recommend ortho referral due to lateral nature of the pain - Ambulatory referral to Orthopedics  4. Vitamin D deficiency Discussed importance of taking supps as recommended.  Recheck level at future appt.  5. Screening examination for venereal disease - GC/chlamydia probe amp, urine  6. Negative pregnancy test - POCT urine pregnancy  Discussed birth control options.  Consider LARC.  Pt declines other methods at this time.  Follow-up:  Return in about 3 months (around 08/27/2015) for PCOS, with Dr. Marina Goodell, with Neysa Bonito.   Medical decision-making:   > 25 minutes spent, more than 50% of appointment was spent discussing diagnosis and management of symptoms

## 2015-05-27 NOTE — Patient Instructions (Addendum)
Back Pain, Adult Back pain is very common. The pain often gets better over time. The cause of back pain is usually not dangerous. Most people can learn to manage their back pain on their own.  HOME CARE  Watch your back pain for any changes. The following actions may help to lessen any pain you are feeling:  Stay active. Start with short walks on flat ground if you can. Try to walk farther each day.  Exercise regularly as told by your doctor. Exercise helps your back heal faster. It also helps avoid future injury by keeping your muscles strong and flexible.  Do not sit, drive, or stand in one place for more than 30 minutes.  Do not stay in bed. Resting more than 1-2 days can slow down your recovery.  Be careful when you bend or lift an object. Use good form when lifting:  Bend at your knees.  Keep the object close to your body.  Do not twist.  Sleep on a firm mattress. Lie on your side, and bend your knees. If you lie on your back, put a pillow under your knees.  Take medicines only as told by your doctor.  You can try Ibuprofen 400-600 mg every 6 hours as needed for the pain.  Take the Ibuprofen with food.  Put ice on the injured area.  Put ice in a plastic bag.  Place a towel between your skin and the bag.  Leave the ice on for 20 minutes, 2-3 times a day for the first 2-3 days. After that, you can switch between ice and heat packs.  Avoid feeling anxious or stressed. Find good ways to deal with stress, such as exercise.  Maintain a healthy weight. Extra weight puts stress on your back. GET HELP IF:   You have pain that does not go away with rest or medicine.  You have worsening pain that goes down into your legs or buttocks.  You have pain that does not get better in one week.  You have pain at night.  You lose weight.  You have a fever or chills. GET HELP RIGHT AWAY IF:   You cannot control when you poop (bowel movement) or pee (urinate).  Your arms or legs  feel weak.  Your arms or legs lose feeling (numbness).  You feel sick to your stomach (nauseous) or throw up (vomit).  You have belly (abdominal) pain.  You feel like you may pass out (faint).   This information is not intended to replace advice given to you by your health care provider. Make sure you discuss any questions you have with your health care provider.   Document Released: 01/25/2008 Document Revised: 08/29/2014 Document Reviewed: 12/10/2013 Elsevier Interactive Patient Education [employer]

## 2015-05-27 NOTE — Progress Notes (Signed)
Pre-Visit Planning  Cynthia Waller  is a 19 y.o. female referred by Verneda Skill, FNP.   Last [facility] on 01/02/2015 for PCOS, obesity and vitamin D deficiency. Pt also has goiter and h/o thyroid cyst.   Previous Psych Screenings?  yes,  PHQ-SADS Completed on: 01/02/2015 PHQ-15: 2 GAD-7: 0 PHQ-9: 1 Reported problems make it not at all difficult to complete activities of daily functioning.   Treatment plan at last visit included healthy lifestyle changes, pt declined metformin, .   Clinical Staff Visit Tasks:   - Urine GC/CT due? yes - Psych Screenings Due? no  Provider Visit Tasks: - Assess PCOS symptoms - Assess goiter and thyroid symptoms - Discussed vitamin D deficiency and confirm supps taken - Pertinent Labs? no  PCOS Labs & Referrals:  - Hgba1c annually if normal, every 3 months if abnormal: Due 08/2015 - CMP annually if normal, as needed if abnormal: Due 08/2015 - CBC annually if normal, as needed if abnormal: Due if metformin is started - Lipid every 2 years if normal, annually if abnormal: Due 08/2015.Should be fasting. - Vitamin D annually if normal, as needed if abnormal: Due after vitamin D supps taken - Nutrition referral: Last seen 2014 - BH Screening: Due 12/2015

## 2015-05-29 LAB — GC/CHLAMYDIA PROBE AMP, URINE
CHLAMYDIA, SWAB/URINE, PCR: NEGATIVE
GC PROBE AMP, URINE: NEGATIVE

## 2015-07-29 ENCOUNTER — Ambulatory Visit: Payer: Self-pay | Admitting: Sports Medicine

## 2015-08-27 ENCOUNTER — Ambulatory Visit (INDEPENDENT_AMBULATORY_CARE_PROVIDER_SITE_OTHER): Payer: Medicaid Other | Admitting: Pediatrics

## 2015-08-27 ENCOUNTER — Encounter: Payer: Self-pay | Admitting: Pediatrics

## 2015-08-27 VITALS — BP 130/81 | HR 100 | Ht 59.5 in | Wt 191.4 lb

## 2015-08-27 DIAGNOSIS — E282 Polycystic ovarian syndrome: Secondary | ICD-10-CM

## 2015-08-27 DIAGNOSIS — Z3202 Encounter for pregnancy test, result negative: Secondary | ICD-10-CM | POA: Diagnosis not present

## 2015-08-27 DIAGNOSIS — Z3049 Encounter for surveillance of other contraceptives: Secondary | ICD-10-CM

## 2015-08-27 LAB — COMPREHENSIVE METABOLIC PANEL
ALK PHOS: 73 U/L (ref 33–115)
ALT: 16 U/L (ref 6–29)
AST: 18 U/L (ref 10–30)
Albumin: 4.5 g/dL (ref 3.6–5.1)
BUN: 9 mg/dL (ref 7–25)
CO2: 27 mmol/L (ref 20–31)
Calcium: 9.8 mg/dL (ref 8.6–10.2)
Chloride: 100 mmol/L (ref 98–110)
Creat: 0.56 mg/dL (ref 0.50–1.10)
Glucose, Bld: 85 mg/dL (ref 65–99)
POTASSIUM: 4.2 mmol/L (ref 3.5–5.3)
Sodium: 136 mmol/L (ref 135–146)
Total Bilirubin: 0.5 mg/dL (ref 0.2–1.2)
Total Protein: 7.2 g/dL (ref 6.1–8.1)

## 2015-08-27 LAB — LIPID PANEL
Cholesterol: 230 mg/dL — ABNORMAL HIGH (ref 125–170)
HDL: 42 mg/dL — AB (ref >=46)
LDL CALC: 138 mg/dL — AB (ref <110)
TRIGLYCERIDES: 248 mg/dL — AB (ref <150)
Total CHOL/HDL Ratio: 5.5 Ratio — ABNORMAL HIGH (ref <=5.0)
VLDL: 50 mg/dL — ABNORMAL HIGH (ref <30)

## 2015-08-27 LAB — TSH: TSH: 0.462 u[IU]/mL (ref 0.350–4.500)

## 2015-08-27 LAB — T4, FREE: Free T4: 1.24 ng/dL (ref 0.80–1.80)

## 2015-08-27 MED ORDER — ETONOGESTREL 68 MG ~~LOC~~ IMPL
68.0000 mg | DRUG_IMPLANT | Freq: Once | SUBCUTANEOUS | Status: AC
  Administered 2015-08-27: 68 mg via SUBCUTANEOUS

## 2015-08-27 NOTE — Progress Notes (Signed)
THIS RECORD MAY CONTAIN CONFIDENTIAL INFORMATION THAT SHOULD NOT BE RELEASED WITHOUT REVIEW OF THE SERVICE PROVIDER.  Adolescent Medicine Consultation Follow-Up Visit Cynthia Waller  is a 20 y.o. female referred by Verneda Skill, FNP here today for follow-up.    Previsit planning completed:  no Last seen in [facility] on 05/27/2015 for PCOS, goiter, left sided low back pain, Vitamin D deficiency.   Previous Psych Screenings? yes, 12/2014  Treatment plan at last visit included patient had discontinued medications and did not desire restarting, symptoms of PCOS had increased, repeat neck ultrasound indicated 2017.   Last ultrasound result: IMPRESSION: No significant interval change in the small 4 mm hypoechoic solid nodule in the inferior aspect of the left gland.  PCOS Labs & Referrals:  - Hgba1c annually if normal, every 3 months if abnormal: Due 08/2015 - CMP annually if normal, as needed if abnormal: Due 08/2015 - CBC annually if normal, as needed if abnormal: Due if metformin is started - Lipid every 2 years if normal, annually if abnormal: Due 08/2015.Should be fasting. - Vitamin D annually if normal, as needed if abnormal: Due after vitamin D supps taken - Nutrition referral: Last seen 2014 - BH Screening: Due 12/2015  Clinical Staff Visit Tasks:   - Urine GC/CT due? no - Psych Screenings Due? no   Growth Chart Viewed? yes   History was provided by the patient.  PCP Confirmed?  yes  My Chart Activated?   yes   HPI:   Cynthia Waller is a 20 yo obese female with PCOS and goiter w thyroid nodule in clinic for PCOS follow up. Patient things have been going well since last visit; she started back on her birth control pills a few weeks ago because she stopped having periods off of the pill. States she forgets to take the pill 2/7 days of the week.  Has not restarted metformin; states had difficulty swallowing the large pills and also it made her have an  upset stomach.  Per patient, periods are "fine"; not too heavy and not painful. She is changing 3 large pads in 24 hours. She endorses mild cramps before her periods. Periods are only during the week when she is off the pill; no breakthrough bleeding.  No acne currently; states this is not an issue. Mood is good without mood swings. States still has mild hair growth under her chin, which she plucks, which has not gotten worse.  Has acanthosis under chin and under arms which is unchanged.  No heat/cold intolerance. No racing heartbeat. No diarrhea/constipation. No new dry skin.   Patient's last menstrual period was 08/14/2015. Allergies  Allergen Reactions  . Penicillins Swelling and Rash  . Amoxicillin Swelling and Rash   Outpatient Encounter Prescriptions as of 08/27/2015  Medication Sig  . norgestimate-ethinyl estradiol (SPRINTEC 28) 0.25-35 MG-MCG tablet Take 1 tablet daily for first 3 rows of pills, skip placebos.  Repeat until 4th pack of pills and then take placebos at the  . [DISCONTINUED] Vitamin D, Ergocalciferol, (DRISDOL) 50000 UNITS CAPS capsule Take 1 capsule (50,000 Units total) by mouth every 7 (seven) days.   No facility-administered encounter medications on file as of 08/27/2015.     Patient Active Problem List   Diagnosis Date Noted  . Left-sided low back pain without sciatica 05/27/2015  . Vitamin D deficiency 01/15/2015  . Goiter 11/01/2013  . Polycystic ovarian syndrome 01/22/2013  . Morbid obesity (HCC) 01/22/2013  . Acanthosis nigricans 01/22/2013     Social  History   Social History Narrative   Currently a junior in college; studying business and accounting. Is sexually active without condoms. Has had new partner since last STI check. No vaginal discharge, pain with sex, or STI symptoms.  Does endorse alcohol use 1-2x per month without the intention of drinking to get drunk. No current or past drug use.           The following portions of the patient's  history were reviewed and updated as appropriate: allergies, current medications, past family history, past medical history, past social history, past surgical history and problem list.  Physical Exam:  Filed Vitals:   08/27/15 0837  BP: 130/81  Pulse: 100  Height: 4' 11.5" (1.511 m)  Weight: 191 lb 6.4 oz (86.818 kg)   BP 130/81 mmHg  Pulse 100  Ht 4' 11.5" (1.511 m)  Wt 191 lb 6.4 oz (86.818 kg)  BMI 38.03 kg/m2  LMP 08/14/2015 Body mass index: body mass index is 38.03 kg/(m^2). Facility age limit for growth percentiles is 20 years.  Physical Exam  Constitutional: She is oriented to person, place, and time. She appears well-developed and well-nourished.  Obese female; pleasant and engaging  HENT:  Head: Normocephalic and atraumatic.  Right Ear: External ear normal.  Left Ear: External ear normal.  Nose: Nose normal.  Mouth/Throat: Oropharynx is clear and moist. No oropharyngeal exudate.  Eyes: Conjunctivae and EOM are normal. Pupils are equal, round, and reactive to light. Right eye exhibits no discharge. Left eye exhibits no discharge. No scleral icterus.  Neck: Normal range of motion. Neck supple. Thyromegaly present.  Goiter present; acanthosis of neck and underarms; hair growth under chin  Cardiovascular: Normal rate, regular rhythm and normal heart sounds.   No murmur heard. Pulmonary/Chest: Effort normal and breath sounds normal. No respiratory distress. She has no wheezes. She has no rales. She exhibits no tenderness.  Abdominal: Bowel sounds are normal. She exhibits no distension and no mass. There is no tenderness.  obese  Musculoskeletal: Normal range of motion. She exhibits no edema or tenderness.  Lymphadenopathy:    She has no cervical adenopathy.  Neurological: She is alert and oriented to person, place, and time. No cranial nerve deficit. She exhibits normal muscle tone.  Skin: Skin is warm and dry.  Acanthosis as described above  Psychiatric: She has a  normal mood and affect. Her behavior is normal. Judgment and thought content normal.  Nursing note and vitals reviewed.    Assessment/Plan:  1. PCOS (polycystic ovarian syndrome) - Going well on current pill regimen; is forgetting pills 2/7 days, but is not interested in new hormonal contraceptives at this time - Discussed hormonal implants, which she states she is not interested in at this time - Will follow back in 3 months - HgB A1c - Comprehensive metabolic panel - Lipid panel - TSH - T4, free  Follow-up:  No Follow-up on file.   Medical decision-making:  > 25 minutes spent, more than 50% of appointment was spent discussing diagnosis and management of symptoms  Carlene CoriaAdriana Isadora Delorey MD 08/27/15 9:40 AM

## 2015-08-27 NOTE — Procedures (Signed)
Nexplanon Insertion  No contraindications for placement.  No liver disease, no unexplained vaginal bleeding, no h/o breast cancer, no h/o blood clots.  Patient's last menstrual period was 08/14/2015.  UHCG: negative  Last Unprotected sex:  > one month ago   Risks & benefits of Nexplanon discussed The nexplanon device was purchased and supplied by [facility]. Packaging instructions supplied to patient Consent form signed  The patient denies any allergies to anesthetics or antiseptics.  Procedure: Pt was placed in supine position. The left arm was flexed at the elbow and externally rotated so that her wrist was parallel to her ear The medial epicondyle of the left arm was identified The insertions site was marked 8 cm proximal to the medial epicondyle The insertion site was cleaned with Betadine The area surrounding the insertion site was covered with a sterile drape 1% lidocaine was injected just under the skin at the insertion site extending 4 cm proximally. The sterile preloaded disposable Nexaplanon applicator was removed from the sterile packaging The applicator needle was inserted at a 30 degree angle at 8 cm proximal to the medial epicondyle as marked The applicator was lowered to a horizontal position and advanced just under the skin for the full length of the needle The slider on the applicator was retracted fully while the applicator remained in the same position, then the applicator was removed. The implant was confirmed via palpation as being in position The implant position was demonstrated to the patient Pressure dressing was applied to the patient.  The patient was instructed to removed the pressure dressing in 24 hrs.  The patient was advised to move slowly from a supine to an upright position  The patient denied any concerns or complaints  The patient was instructed to schedule a follow-up appt in 1 month and to call sooner if any concerns.  The patient acknowledged  agreement and understanding of the plan.

## 2015-08-28 LAB — HEMOGLOBIN A1C
Hgb A1c MFr Bld: 5.4 % (ref <5.7)
MEAN PLASMA GLUCOSE: 108 mg/dL (ref <117)

## 2015-10-02 ENCOUNTER — Other Ambulatory Visit: Payer: Self-pay | Admitting: Family

## 2015-10-02 ENCOUNTER — Encounter: Payer: Self-pay | Admitting: Family

## 2015-10-02 ENCOUNTER — Ambulatory Visit (INDEPENDENT_AMBULATORY_CARE_PROVIDER_SITE_OTHER): Payer: Medicaid Other | Admitting: Family

## 2015-10-02 VITALS — BP 138/76 | HR 112 | Ht 59.0 in | Wt 187.4 lb

## 2015-10-02 DIAGNOSIS — Z130 Encounter for screening for diseases of the blood and blood-forming organs and certain disorders involving the immune mechanism: Secondary | ICD-10-CM | POA: Diagnosis not present

## 2015-10-02 DIAGNOSIS — Z975 Presence of (intrauterine) contraceptive device: Secondary | ICD-10-CM | POA: Diagnosis not present

## 2015-10-02 DIAGNOSIS — Z113 Encounter for screening for infections with a predominantly sexual mode of transmission: Secondary | ICD-10-CM | POA: Diagnosis not present

## 2015-10-02 DIAGNOSIS — Z7251 High risk heterosexual behavior: Secondary | ICD-10-CM | POA: Diagnosis not present

## 2015-10-02 DIAGNOSIS — N921 Excessive and frequent menstruation with irregular cycle: Secondary | ICD-10-CM | POA: Diagnosis not present

## 2015-10-02 DIAGNOSIS — Z3202 Encounter for pregnancy test, result negative: Secondary | ICD-10-CM

## 2015-10-02 DIAGNOSIS — N939 Abnormal uterine and vaginal bleeding, unspecified: Secondary | ICD-10-CM | POA: Insufficient documentation

## 2015-10-02 LAB — POCT HEMOGLOBIN: HEMOGLOBIN: 13.2 g/dL (ref 12.2–16.2)

## 2015-10-02 LAB — POCT URINE PREGNANCY: Preg Test, Ur: NEGATIVE

## 2015-10-02 MED ORDER — MELOXICAM 7.5 MG PO TABS: ORAL_TABLET | ORAL | Status: DC

## 2015-10-02 NOTE — Progress Notes (Signed)
THIS RECORD MAY CONTAIN CONFIDENTIAL INFORMATION THAT SHOULD NOT BE RELEASED WITHOUT REVIEW OF THE SERVICE PROVIDER.  Adolescent Medicine Consultation Follow-Up Visit Cynthia Waller  is a 20 y.o. female referred by Cynthia Skill, FNP here today for follow-up.    Previsit planning completed:  Yes  Patient ID: Cynthia Waller, female   DOB: 01/30/1996, 20 y.o.   MRN: [id number]  Pre-Visit Planning  Cynthia Waller  is a 20 y.o. female referred by Cynthia Skill, FNP.   Last seen in [facility] on 08/27/15 for PCOS and Nexplanon placement.   Previous Psych Screenings? yes  PHQ-SADS Completed on: 01/02/2015 PHQ-15: 2 GAD-7: 0 PHQ-9: 1 Reported problems make it not at all difficult to complete activities of daily functioning.   Treatment plan at last visit included   Clinical Staff Visit Tasks:   - Urine GC/CT due? no - Psych Screenings Due? no - fs hgb if bleeding   Provider Visit Tasks: - assess nexplanon site; bleeding - [facility] Involvement? No - Pertinent Labs? no  Growth Chart Viewed? yes   History was provided by the patient.  PCP Confirmed?  Cynthia Buddy, FNP-C  My Chart Activated?   yes   HPI:    Nexplanon insertion on 1/5 - started vaginal spotting about 2 weeks after insertion. Then period on 1/27 and still bleeding.  Past week has been having heavy cramping. No clotting. Has been taking sprintec still daily for PCOS with no missed pills since the bleeding started. Concerned because she had unprotected sex in the seven days after the Nexplanon insertion. She is concerned because she also had stopped taking the Sprintec during that time.    Patient's last menstrual period was 09/18/2015. Allergies  Allergen Reactions  . Penicillins Swelling and Rash  . Amoxicillin Swelling and Rash   Outpatient Encounter Prescriptions as of 10/02/2015  Medication Sig  . norgestimate-ethinyl estradiol (SPRINTEC 28) 0.25-35 MG-MCG  tablet Take 1 tablet daily for first 3 rows of pills, skip placebos.  Repeat until 4th pack of pills and then take placebos at the   No facility-administered encounter medications on file as of 10/02/2015.     Patient Active Problem List   Diagnosis Date Noted  . Left-sided low back pain without sciatica 05/27/2015  . Vitamin D deficiency 01/15/2015  . Goiter 11/01/2013  . Polycystic ovarian syndrome 01/22/2013  . Morbid obesity (HCC) 01/22/2013  . Acanthosis nigricans 01/22/2013    Social History   Social History Narrative   Currently a Holiday representative in college; studying business and accounting. Is sexually active without condoms. Has had new partner since last STI check. No vaginal discharge, pain with sex, or STI symptoms.  Does endorse alcohol use 1-2x per month without the intention of drinking to get drunk. No current or past drug use.           The following portions of the patient's history were reviewed and updated as appropriate: allergies, current medications, past family history, past medical history, past social history, past surgical history and problem list.  Physical Exam:  Filed Vitals:   10/02/15 1458  BP: 138/76  Pulse: 112  Height:  (1.499 m)  Weight: 187 lb 6.4 oz (85.004 kg)   BP 138/76 mmHg  Pulse 112  Ht  (1.499 m)  Wt 187 lb 6.4 oz (85.004 kg)  BMI 37.83 kg/m2  LMP 09/18/2015 Body mass index: body mass index is 37.83 kg/(m^2). Facility age limit for growth percentiles is 20 years.  Physical  Exam  Constitutional: She appears well-nourished. No distress.  obese  HENT:  Head: Normocephalic.  Mouth/Throat: Oropharynx is clear and moist.  Eyes: EOM are normal. Pupils are equal, round, and reactive to light. No scleral icterus.  Neck: Normal range of motion. Neck supple.  Cardiovascular: Normal rate and regular rhythm.   No murmur heard. Pulmonary/Chest: Effort normal.  Abdominal: Soft.  Musculoskeletal: Normal range of motion. She exhibits  no edema or tenderness.  Neurological: She is alert.  Skin: Skin is warm and dry.  Nexplanon palpable in L subdermal upper extremity      Assessment/Plan: 1. Breakthrough bleeding on Nexplanon -try Mobic 7.5 mg once daily with food x 7 days for heavier bleeding.   -continue Sprintec as directed.   2. Unprotected sexual intercourse -pregnancy test negative today. - POCT urine pregnancy  3. Routine screening for STI (sexually transmitted infection) -Reviewed condom use.  - GC/chlamydia probe amp, urine  4. Screening for iron deficiency anemia 13.2 Hgb, stable. Reviewed with patient.  - POCT hemoglobin   Follow-up:  Return keep scheduled appointment; call if symptoms worsen or new.   Medical decision-making:  > 15 minutes spent, more than 50% of appointment was spent discussing diagnosis and management of symptoms

## 2015-10-02 NOTE — Patient Instructions (Signed)
Please call me after you have tried seven days of this medication.

## 2015-10-02 NOTE — Progress Notes (Signed)
Patient ID: Cynthia Waller, female   DOB: 08-25-1995, 20 y.o.   MRN: [id number]  Pre-Visit Planning  Cynthia Waller  is a 20 y.o. female referred by Verneda Skill, FNP.   Last seen in [facility] on 08/27/15 for PCOS and Nexplanon placement.   Previous Psych Screenings? yes  PHQ-SADS Completed on: 01/02/2015 PHQ-15: 2 GAD-7: 0 PHQ-9: 1 Reported problems make it not at all difficult to complete activities of daily functioning.   Treatment plan at last visit included   Clinical Staff Visit Tasks:   - Urine GC/CT due? no - Psych Screenings Due? no - fs hgb if bleeding   Provider Visit Tasks: - assess nexplanon site; bleeding - [facility] Involvement? No - Pertinent Labs? no

## 2015-10-03 LAB — GC/CHLAMYDIA PROBE AMP
CT Probe RNA: NOT DETECTED
GC Probe RNA: NOT DETECTED

## 2015-10-05 ENCOUNTER — Telehealth: Payer: Self-pay | Admitting: *Deleted

## 2015-10-05 NOTE — Telephone Encounter (Signed)
Attempted tc to pt x2. Unable to lvm as vm box has not been set up. Will update pt when able.

## 2015-10-05 NOTE — Telephone Encounter (Signed)
-----   Message from Christianne Dolin, NP sent at 10/05/2015  8:43 AM EST ----- Negative gc/c. Notify patient.

## 2015-10-06 ENCOUNTER — Encounter: Payer: Self-pay | Admitting: Family

## 2015-10-17 ENCOUNTER — Encounter: Payer: Self-pay | Admitting: Family

## 2015-10-23 ENCOUNTER — Other Ambulatory Visit: Payer: Self-pay | Admitting: Family

## 2015-10-24 ENCOUNTER — Other Ambulatory Visit: Payer: Self-pay | Admitting: Family

## 2015-10-28 ENCOUNTER — Telehealth: Payer: Self-pay | Admitting: *Deleted

## 2015-10-28 NOTE — Telephone Encounter (Signed)
VM from pt. [facility] [address]

## 2015-11-09 ENCOUNTER — Encounter: Payer: Self-pay | Admitting: *Deleted

## 2015-11-09 ENCOUNTER — Other Ambulatory Visit: Payer: Self-pay | Admitting: Pediatrics

## 2015-11-09 ENCOUNTER — Encounter: Payer: Self-pay | Admitting: Pediatrics

## 2015-11-09 ENCOUNTER — Ambulatory Visit (INDEPENDENT_AMBULATORY_CARE_PROVIDER_SITE_OTHER): Payer: Medicaid Other | Admitting: Pediatrics

## 2015-11-09 VITALS — BP 131/81 | HR 96 | Ht 59.55 in | Wt 192.0 lb

## 2015-11-09 DIAGNOSIS — Z113 Encounter for screening for infections with a predominantly sexual mode of transmission: Secondary | ICD-10-CM

## 2015-11-09 DIAGNOSIS — N921 Excessive and frequent menstruation with irregular cycle: Secondary | ICD-10-CM | POA: Diagnosis not present

## 2015-11-09 DIAGNOSIS — Z975 Presence of (intrauterine) contraceptive device: Secondary | ICD-10-CM

## 2015-11-09 DIAGNOSIS — E282 Polycystic ovarian syndrome: Secondary | ICD-10-CM | POA: Diagnosis not present

## 2015-11-09 DIAGNOSIS — Z130 Encounter for screening for diseases of the blood and blood-forming organs and certain disorders involving the immune mechanism: Secondary | ICD-10-CM

## 2015-11-09 LAB — POCT HEMOGLOBIN: HEMOGLOBIN: 14.2 g/dL (ref 12.2–16.2)

## 2015-11-09 MED ORDER — MELOXICAM 7.5 MG PO TABS: ORAL_TABLET | ORAL | Status: DC

## 2015-11-09 NOTE — Patient Instructions (Addendum)
Continue sprintec daily. We will do more mobic for a little longer time to see if that clears up your bleeding. If it doesn't, let [place] know and we will change your birth control pill to something that should help stabilize things a little better. Your hemoglobin was normal today!   Take 2 tablets by mouth today with food. After that, take 1 tablet by mouth daily for 10 days.

## 2015-11-09 NOTE — Progress Notes (Signed)
THIS RECORD MAY CONTAIN CONFIDENTIAL INFORMATION THAT SHOULD NOT BE RELEASED WITHOUT REVIEW OF THE SERVICE PROVIDER.  Adolescent Medicine Consultation Follow-Up Visit Cynthia Waller  is a 20 y.o. female referred by Verneda SkillHacker, Caroline T, FNP here today for follow-up.    Previsit planning completed:  no  Growth Chart Viewed? yes   History was provided by the patient.  PCP Confirmed?  yes  My Chart Activated?   yes   HPI:    Meloxicam helped the first time and stopped the bleeding. The second time it wasn't able to get the bleeding to stop. It is heavy. She is going through about 3 pads in a day and they are all saturated.   Sexually active since the last time here. Started bleeding right after the last encounter. Did not use condoms. They are exclusive. No other discharge, rashes or odors. She had some cramping before she started meloxicam but is gone now. No pain with sex.   She had a cold last week and had headache.   Still taking OCP. Not missing.   Review of Systems  Constitutional: Negative for weight loss and malaise/fatigue.  Eyes: Negative for blurred vision.  Respiratory: Negative for shortness of breath.   Cardiovascular: Negative for chest pain and palpitations.  Gastrointestinal: Negative for nausea, vomiting, abdominal pain and constipation.  Genitourinary: Negative for dysuria.  Musculoskeletal: Negative for myalgias.  Neurological: Positive for headaches. Negative for dizziness.  Psychiatric/Behavioral: Negative for depression.     Patient's last menstrual period was 10/17/2015. Allergies  Allergen Reactions  . Penicillins Swelling and Rash  . Amoxicillin Swelling and Rash   Outpatient Encounter Prescriptions as of 11/09/2015  Medication Sig  . norgestimate-ethinyl estradiol (SPRINTEC 28) 0.25-35 MG-MCG tablet Take 1 tablet daily for first 3 rows of pills, skip placebos.  Repeat until 4th pack of pills and then take placebos at the  . meloxicam  (MOBIC) 7.5 MG tablet TAKE 1 TABLET BY MOUTH EVERY DAY WITH FOOD FOR 7 DAYS (Patient not taking: Reported on 11/09/2015)   No facility-administered encounter medications on file as of 11/09/2015.     Patient Active Problem List   Diagnosis Date Noted  . Vaginal bleeding 10/02/2015  . Nexplanon in place 10/02/2015  . Screening for iron deficiency anemia 10/02/2015  . Left-sided low back pain without sciatica 05/27/2015  . Vitamin D deficiency 01/15/2015  . Goiter 11/01/2013  . Polycystic ovarian syndrome 01/22/2013  . Morbid obesity (HCC) 01/22/2013  . Acanthosis nigricans 01/22/2013    Social History   Social History Narrative   Currently a [employer] in college; studying business and accounting. Is sexually active without condoms. Has had new partner since last STI check. No vaginal discharge, pain with sex, or STI symptoms.  Does endorse alcohol use 1-2x per month without the intention of drinking to get drunk. No current or past drug use.           The following portions of the patient's history were reviewed and updated as appropriate: allergies, current medications, past family history, past medical history, past social history and problem list.  Physical Exam:  Filed Vitals:   11/09/15 0834  BP: 131/81  Pulse: 96  Height: 4' 11.55" (1.512 m)  Weight: 192 lb (87.091 kg)   BP 131/81 mmHg  Pulse 96  Ht 4' 11.55" (1.512 m)  Wt 192 lb (87.091 kg)  BMI 38.10 kg/m2  LMP 10/17/2015 Body mass index: body mass index is 38.1 kg/(m^2). Facility age limit for growth percentiles is  20 years.  Physical Exam  Constitutional: She is oriented to person, place, and time. She appears well-developed and well-nourished.  HENT:  Head: Normocephalic.  Neck: Thyromegaly present.  Cardiovascular: Normal rate, regular rhythm, normal heart sounds and intact distal pulses.   Pulmonary/Chest: Effort normal and breath sounds normal.  Abdominal: Soft. Bowel sounds are normal. There is no  tenderness.  Musculoskeletal: Normal range of motion.  Neurological: She is alert and oriented to person, place, and time.  Skin: Skin is warm and dry.  Psychiatric: She has a normal mood and affect.     Assessment/Plan: 1. Breakthrough bleeding on Nexplanon Check for infection. Try higher dose x 1 day of mobic + 10 day course. If this does not work can switch to lo/ovral OCP, however, prefer sprintec if possible for antiandrogen properties for her PCOS.  - WET PREP BY MOLECULAR PROBE - GC/Chlamydia Probe Amp - meloxicam (MOBIC) 7.5 MG tablet; Take 2 tablets by mouth with food on the first day. Take 1 tablet daily by mouth after that for 10 days.  Dispense: 12 tablet; Refill: 0  2. Nexplanon in place No concerns with placement today.   3. Morbid obesity, unspecified obesity type (HCC) Continues with small weight gain.   4. Polycystic ovarian syndrome Currently treated with OCP and nexplanon. Given significant acanthosis would likely look at add metformin next.   5. Screening for iron deficiency anemia Results for orders placed or performed in visit on 11/09/15  POCT hemoglobin  Result Value Ref Range   Hemoglobin 14.2 12.2 - 16.2 g/dL    - POCT hemoglobin  6. Routine screening for STI (sexually transmitted infection) Will follow up on infectious causes of bleeding given unprotected intercourse.  - GC/Chlamydia Probe Amp   Follow-up:  4 weeks  Medical decision-making:  > 25 minutes spent, more than 50% of appointment was spent discussing diagnosis and management of symptoms

## 2015-11-10 LAB — GC/CHLAMYDIA PROBE AMP
CT Probe RNA: NOT DETECTED
GC Probe RNA: NOT DETECTED

## 2015-11-11 LAB — WET PREP BY MOLECULAR PROBE
CANDIDA SPECIES: NEGATIVE
GARDNERELLA VAGINALIS: POSITIVE — AB
TRICHOMONAS VAG: NEGATIVE

## 2015-11-19 ENCOUNTER — Encounter: Payer: Self-pay | Admitting: Pediatrics

## 2015-11-20 ENCOUNTER — Other Ambulatory Visit: Payer: Self-pay | Admitting: Pediatrics

## 2015-11-20 MED ORDER — NORGESTREL-ETHINYL ESTRADIOL 0.3-30 MG-MCG PO TABS: 1.0000 | ORAL_TABLET | Freq: Every day | ORAL | Status: DC

## 2015-12-04 ENCOUNTER — Ambulatory Visit: Payer: Self-pay | Admitting: Pediatrics

## 2015-12-07 ENCOUNTER — Encounter: Payer: Self-pay | Admitting: Family

## 2015-12-07 ENCOUNTER — Ambulatory Visit (INDEPENDENT_AMBULATORY_CARE_PROVIDER_SITE_OTHER): Payer: Medicaid Other | Admitting: Family

## 2015-12-07 VITALS — BP 134/82 | HR 111 | Ht 59.25 in | Wt 190.0 lb

## 2015-12-07 DIAGNOSIS — Z975 Presence of (intrauterine) contraceptive device: Secondary | ICD-10-CM

## 2015-12-07 DIAGNOSIS — E049 Nontoxic goiter, unspecified: Secondary | ICD-10-CM | POA: Diagnosis not present

## 2015-12-07 DIAGNOSIS — N921 Excessive and frequent menstruation with irregular cycle: Secondary | ICD-10-CM | POA: Diagnosis not present

## 2015-12-07 DIAGNOSIS — D509 Iron deficiency anemia, unspecified: Secondary | ICD-10-CM | POA: Diagnosis not present

## 2015-12-07 LAB — TSH: TSH: 3.41 m[IU]/L

## 2015-12-07 LAB — T4, FREE: FREE T4: 1.1 ng/dL (ref 0.8–1.4)

## 2015-12-07 LAB — POCT HEMOGLOBIN: HEMOGLOBIN: 10.9 g/dL — AB (ref 12.2–16.2)

## 2015-12-07 NOTE — Progress Notes (Signed)
THIS RECORD MAY CONTAIN CONFIDENTIAL INFORMATION THAT SHOULD NOT BE RELEASED WITHOUT REVIEW OF THE SERVICE PROVIDER.  Adolescent Medicine Consultation Follow-Up Visit Cynthia Waller  is a 20 y.o. female referred by Verneda Skill, FNP here today for follow-up.    Previsit planning completed:  no  Growth Chart Viewed? no   History was provided by the patient.  PCP Confirmed?  Yes, Alfonso Ramus, FNP-C  My Chart Activated?   yes   HPI:     On 4/6 she began a 3 x day Lo/Ovral use for BTB with Nexplanon with one week taper.  Bleeding did not stop completely; has some spotting but much less.  Today she had blood clots and more heavy bleeding.  No new sexual partner.   -Some back pain over the last 2 weeks. Has tried ice, bengay, icyhot, ibuprofen.  No antecedent trauma noted. Started about 2 weeks ago Monday was uncomfortable sitting down, progressively worsening over the last 3 days. Denies incontinence or paraesthesia.  No other bleeding noted; no HAs, SOB.     Patient's last menstrual period was 10/17/2015 (lmp unknown). Allergies  Allergen Reactions  . Penicillins Swelling and Rash  . Amoxicillin Swelling and Rash   Outpatient Encounter Prescriptions as of 12/07/2015  Medication Sig  . norgestrel-ethinyl estradiol (LO/OVRAL,CRYSELLE) 0.3-30 MG-MCG tablet Take 1 tablet by mouth daily.  . [DISCONTINUED] norgestimate-ethinyl estradiol (SPRINTEC 28) 0.25-35 MG-MCG tablet Take 1 tablet daily for first 3 rows of pills, skip placebos.  Repeat until 4th pack of pills and then take placebos at the  . [DISCONTINUED] meloxicam (MOBIC) 7.5 MG tablet Take 2 tablets by mouth with food on the first day. Take 1 tablet daily by mouth after that for 10 days.   No facility-administered encounter medications on file as of 12/07/2015.     Patient Active Problem List   Diagnosis Date Noted  . Vaginal bleeding 10/02/2015  . Nexplanon in place 10/02/2015  . Screening for iron  deficiency anemia 10/02/2015  . Left-sided low back pain without sciatica 05/27/2015  . Vitamin D deficiency 01/15/2015  . Goiter 11/01/2013  . Polycystic ovarian syndrome 01/22/2013  . Morbid obesity (HCC) 01/22/2013  . Acanthosis nigricans 01/22/2013    Confidentiality was discussed with the patient and if applicable, with caregiver as well.  Patient's personal or confidential phone number: [phone]  The following portions of the patient's history were reviewed and updated as appropriate: allergies, current medications, past family history, past medical history, past social history, past surgical history and problem list.  Physical Exam:  Filed Vitals:   12/07/15 1042  BP: 134/82  Pulse: 111  Height: 4' 11.25" (1.505 m)  Weight: 190 lb (86.183 kg)   BP 134/82 mmHg  Pulse 111  Ht 4' 11.25" (1.505 m)  Wt 190 lb (86.183 kg)  BMI 38.05 kg/m2  LMP 10/17/2015 (LMP Unknown) Body mass index: body mass index is 38.05 kg/(m^2). Facility age limit for growth percentiles is 20 years.  Physical Exam  Constitutional: She appears well-nourished. No distress.  obese  HENT:  Head: Normocephalic.  Mouth/Throat: Oropharynx is clear and moist.  Eyes: EOM are normal. Pupils are equal, round, and reactive to light. No scleral icterus.  Neck: Normal range of motion. Neck supple.  Cardiovascular: Normal rate and regular rhythm.   No murmur heard. Pulmonary/Chest: Effort normal.  Abdominal: Soft.  Genitourinary: Vagina normal and uterus normal.  Cervix normal; brownish-red blood from os. No CMT.    Musculoskeletal: Normal range of motion. She exhibits  no edema or tenderness.  Neurological: She is alert.  Skin: Skin is warm and dry.  Nexplanon palpable in L subdermal upper extremity   Psychiatric: She has a normal mood and affect.     Assessment/Plan: 1. Breakthrough bleeding on Nexplanon  - POCT hemoglobin, 10.9 reviewed with patient. Will send in iron supplement.  - vaginal  bleeding in the context of worsening back pain, will send for imaging.  - US Pelvis Complete; Future - US Transvaginal Non-OB; Future  2. Nexplanon in place  3. Goiter -will check TFTs due to vaginal bleeding  - T4, free - TSH   Follow-up:  Return in about 1 week (around 12/14/2015) for GYN/Reproductive Health concerns.   Medical decision-making:  >25 minutes spent, more than 50% of appointment was spent discussing diagnosis and management of symptoms

## 2015-12-10 MED ORDER — FERROUS SULFATE 325 (65 FE) MG PO TBEC
325.0000 mg | DELAYED_RELEASE_TABLET | Freq: Three times a day (TID) | ORAL | Status: DC
Start: 2015-12-10 — End: 2015-12-28

## 2015-12-14 ENCOUNTER — Telehealth: Payer: Self-pay | Admitting: *Deleted

## 2015-12-14 NOTE — Telephone Encounter (Signed)
-----   Message from Christianne Dolinhristy Millican, NP sent at 12/13/2015  2:30 PM EDT ----- TSH and T4F normal - notify patient, thanks.

## 2015-12-14 NOTE — Telephone Encounter (Signed)
TC to pt. Requested parent have pt call clinic to review labs. Parent agreeable to give pt message.

## 2015-12-21 ENCOUNTER — Ambulatory Visit (HOSPITAL_COMMUNITY)
Admission: RE | Admit: 2015-12-21 | Discharge: 2015-12-21 | Disposition: A | Discharge status: Home / Self Care | Payer: Medicaid Other | Source: Ambulatory Visit | Attending: Family | Admitting: Family

## 2015-12-21 DIAGNOSIS — E282 Polycystic ovarian syndrome: Secondary | ICD-10-CM | POA: Diagnosis not present

## 2015-12-21 DIAGNOSIS — Z975 Presence of (intrauterine) contraceptive device: Secondary | ICD-10-CM

## 2015-12-21 DIAGNOSIS — N921 Excessive and frequent menstruation with irregular cycle: Secondary | ICD-10-CM | POA: Diagnosis present

## 2015-12-25 ENCOUNTER — Encounter: Payer: Self-pay | Admitting: Family

## 2015-12-25 ENCOUNTER — Ambulatory Visit: Payer: Self-pay | Admitting: Family

## 2015-12-25 NOTE — Progress Notes (Signed)
Patient ID: Cynthia Waller, female   DOB: 05/01/1996, 20 y.o.   MRN: [id number] Pre-Visit Planning  Cynthia Waller  is a 20 y.o. female referred by Verneda SkillHacker,Caroline T, FNP.   Last seen in [facility]olescent Medicine Clinic on 12/07/15 for BTB on Nexplanon, anemia.  Plan at last visit included imaging for BTB in context of acute back pain.  Date and Type of Previous Psych Screenings? NA  Clinical Staff Visit Tasks:   - Urine GC/CT due? no - HIV Screening due?  no - Psych Screenings Due? NA - poct hgb  Provider Visit Tasks: - review imaging, refer to GYN, discuss BTB - [facility] - Pertinent Labs? Yes  Lab Results  Component Value Date   HGB 10.9* 12/07/2015     >2 minutes spent reviewing records and planning for patient's visit.

## 2015-12-28 ENCOUNTER — Encounter: Payer: Self-pay | Admitting: Family

## 2015-12-28 ENCOUNTER — Ambulatory Visit (INDEPENDENT_AMBULATORY_CARE_PROVIDER_SITE_OTHER): Payer: Medicaid Other | Admitting: Pediatrics

## 2015-12-28 VITALS — BP 136/80 | HR 95 | Ht 59.45 in | Wt 187.6 lb

## 2015-12-28 DIAGNOSIS — Z130 Encounter for screening for diseases of the blood and blood-forming organs and certain disorders involving the immune mechanism: Secondary | ICD-10-CM

## 2015-12-28 DIAGNOSIS — E282 Polycystic ovarian syndrome: Secondary | ICD-10-CM | POA: Diagnosis not present

## 2015-12-28 DIAGNOSIS — N921 Excessive and frequent menstruation with irregular cycle: Secondary | ICD-10-CM

## 2015-12-28 DIAGNOSIS — N852 Hypertrophy of uterus: Secondary | ICD-10-CM | POA: Diagnosis not present

## 2015-12-28 DIAGNOSIS — Z975 Presence of (intrauterine) contraceptive device: Secondary | ICD-10-CM | POA: Diagnosis not present

## 2015-12-28 LAB — POCT HEMOGLOBIN: HEMOGLOBIN: 11.4 g/dL — AB (ref 12.2–16.2)

## 2015-12-28 MED ORDER — NORGESTIMATE-ETH ESTRADIOL 0.25-35 MG-MCG PO TABS: 1.0000 | ORAL_TABLET | Freq: Every day | ORAL | Status: DC

## 2015-12-28 NOTE — Progress Notes (Signed)
Patient ID: Cynthia Waller, female   DOB: 16-Nov-1995, 20 y.o.   MRN: [id number] Pre-Visit Planning  Cynthia Waller  is a 20 y.o. female referred by Verneda SkillHacker,Caroline T, FNP.   Last seen in [facility] Medicine Clinic on 12/28/15 for BTB on Nexplanon.  Plan at last visit included contraceptive counseling; switch to Sprintec only; return for nexplanon removal.  Date and Type of Previous Psych Screenings? NA  Clinical Staff Visit Tasks:   - Urine GC/CT due? no - HIV Screening due?  no - Psych Screenings Due? NA - prepare for nexplanon removal   Provider Visit Tasks: - nexplanon removal, confirm sprintec use - [facility]? No - Pertinent Labs? Yes Lab Results  Component Value Date   HGB 11.4* 12/28/2015     >2 minutes spent reviewing records and planning for patient's visit.

## 2015-12-28 NOTE — Patient Instructions (Signed)
Come back tomorrow for nexplanon removal with Neysa BonitoChristy!  Get scheduled with gynecology.

## 2015-12-28 NOTE — Progress Notes (Signed)
Patient ID: Cynthia Waller, female   DOB: 1995/10/29, 20 y.o.   MRN: [id number] Pre-Visit Planning  -Refer to 12/25/15 PVP. Reviewed.

## 2015-12-28 NOTE — Progress Notes (Signed)
THIS RECORD MAY CONTAIN CONFIDENTIAL INFORMATION THAT SHOULD NOT BE RELEASED WITHOUT REVIEW OF THE SERVICE PROVIDER.  Adolescent Medicine Consultation Follow-Up Visit Cynthia Waller  is a 20 y.o. female referred by Verneda Skill, FNP here today for follow-up.    Previsit planning completed:  yes  Patient ID: Cynthia Waller, female DOB: 01-29-1996, 19 y.o. MRN: [id number] Pre-Visit Planning  [name] is a 20 y.o. female referred by Verneda Skill, FNP.  Last seen in [facility]oles[facility] Medicine Clinic on 12/07/15 for BTB on Nexplanon, anemia.  Plan at last visit included imaging for BTB in context of acute back pain.  Date and Type of Previous Psych Screenings? NA  Clinical Staff Visit Tasks:  - Urine GC/CT due? no - HIV Screening due? no - Psych Screenings Due? NA - poct hgb  Provider Visit Tasks: - review imaging, refer to GYN, discuss BTB - [facility] Involvement? No - Pertinent Labs? Yes   Recent Labs    Lab Results  Component Value Date   HGB 10.9* 12/07/2015       >2 minutes spent reviewing records and planning for patient's visit.          Growth Chart Viewed? yes   History was provided by the patient.  PCP Confirmed?  yes  My Chart Activated?   yes   HPI:    Micah Flesher to the doctor at school and she had sciatica. She got a shot for that and much better.  Still having vaginal bleeding which has improved. She is having small clots but not large ones anymore. Still taking OCP. Using 2-3 pads/tampons in a day that are mostly saturated.  Has noticed mood changes at work- boss has commented. Thinks maybe temper is shorter.  Sexually active but not since bleeding- she would like to get nexplanon removed and continue on OCP in the future. She remembers to take them well.  She has taken metformin in the past and gave her bad stomach upset.  Does not note any changes in thyroid size.    Review of Systems   Constitutional: Negative for weight loss and malaise/fatigue.  Eyes: Negative for blurred vision.  Respiratory: Negative for shortness of breath.   Cardiovascular: Negative for chest pain and palpitations.  Gastrointestinal: Negative for nausea, vomiting, abdominal pain and constipation.  Genitourinary: Negative for dysuria.  Musculoskeletal: Negative for myalgias.  Neurological: Negative for dizziness and headaches.  Psychiatric/Behavioral: Negative for depression.     Patient's last menstrual period was 10/17/2015 (lmp unknown). Allergies  Allergen Reactions  . Penicillins Swelling and Rash  . Amoxicillin Swelling and Rash   Outpatient Prescriptions Prior to Visit  Medication Sig Dispense Refill  . norgestrel-ethinyl estradiol (LO/OVRAL,CRYSELLE) 0.3-30 MG-MCG tablet Take 1 tablet by mouth daily. 1 Package 11  . ferrous sulfate (FE TABS) 325 (65 FE) MG EC tablet Take 1 tablet (325 mg total) by mouth 3 (three) times daily with meals. 90 tablet 0   No facility-administered medications prior to visit.     Patient Active Problem List   Diagnosis Date Noted  . Vaginal bleeding 10/02/2015  . Nexplanon in place 10/02/2015  . Screening for iron deficiency anemia 10/02/2015  . Left-sided low back pain without sciatica 05/27/2015  . Vitamin D deficiency 01/15/2015  . Goiter 11/01/2013  . Polycystic ovarian syndrome 01/22/2013  . Morbid obesity (HCC) 01/22/2013  . Acanthosis nigricans 01/22/2013    The following portions of the patient's history were reviewed and updated as appropriate: allergies, current medications, past family history, past  medical history, past social history and problem list.  Physical Exam:  Filed Vitals:   12/28/15 0938  BP: 136/80  Pulse: 95  Height: 4' 11.45" (1.51 m)  Weight: 187 lb 9.6 oz (85.095 kg)   BP 136/80 mmHg  Pulse 95  Ht 4' 11.45" (1.51 m)  Wt 187 lb 9.6 oz (85.095 kg)  BMI 37.32 kg/m2  LMP 10/17/2015 (LMP Unknown) Body mass index:  body mass index is 37.32 kg/(m^2). Facility age limit for growth percentiles is 20 years.  Physical Exam  Constitutional: She is oriented to person, place, and time. She appears well-developed and well-nourished.  HENT:  Head: Normocephalic.  Neck: Thyromegaly present.  Cardiovascular: Normal rate, regular rhythm, normal heart sounds and intact distal pulses.   Pulmonary/Chest: Effort normal and breath sounds normal.  Abdominal: Soft. Bowel sounds are normal. There is no tenderness.  Musculoskeletal: Normal range of motion.  Neurological: She is alert and oriented to person, place, and time.  Skin: Skin is warm and dry.  Acanthosis   Psychiatric: She has a normal mood and affect.    Assessment/Plan: 1. Polycystic ovarian syndrome Has tried metformin in the past without success. Will continue OCP in the future for contraception and menstrual regulation.  - norgestimate-ethinyl estradiol (ORTHO-CYCLEN,SPRINTEC,PREVIFEM) 0.25-35 MG-MCG tablet; Take 1 tablet by mouth daily.  Dispense: 1 Package; Refill: 11  2. Breakthrough bleeding on Nexplanon Have tried multiple therapies for BTB without success. Will return tomorrow for removal of nexplanon.   3. Enlarged uterus Will refer to GYN for assessment of ultrasound and any further workup given she is 19 and continues with bleeding in the setting of nexplanon and mildly enlarged uterus on u/s.  - Ambulatory referral to Gynecology  4. Screening for iron deficiency anemia Results for orders placed or performed in visit on 12/28/15  POCT hemoglobin  Result Value Ref Range   Hemoglobin 11.4 (A) 12.2 - 16.2 g/dL   Slightly improved today.  - POCT hemoglobin   Follow-up:  Tomorrow for nexplanon removal   Medical decision-making:  > 25 minutes spent, more than 50% of appointment was spent discussing diagnosis and management of symptoms

## 2015-12-29 ENCOUNTER — Ambulatory Visit (INDEPENDENT_AMBULATORY_CARE_PROVIDER_SITE_OTHER): Payer: Medicaid Other | Admitting: Family

## 2015-12-29 ENCOUNTER — Encounter: Payer: Self-pay | Admitting: Family

## 2015-12-29 VITALS — BP 144/85 | HR 94 | Ht 59.45 in | Wt 190.3 lb

## 2015-12-29 DIAGNOSIS — Z3046 Encounter for surveillance of implantable subdermal contraceptive: Secondary | ICD-10-CM

## 2015-12-29 NOTE — Progress Notes (Signed)
THIS RECORD MAY CONTAIN CONFIDENTIAL INFORMATION THAT SHOULD NOT BE RELEASED WITHOUT REVIEW OF THE SERVICE PROVIDER.  Adolescent Medicine Consultation Follow-Up Visit Cynthia Waller  is a 20 y.o. female referred by Verneda Skill, FNP here today for follow-up.    Previsit planning completed:  yes  Growth Chart Viewed? not applicable   History was provided by the patient.  PCP Confirmed?  Myrene Buddy, FNP-C  My Chart Activated?   yes   HPI:   Mother present for visit.  Returns for nexplanon removal due to breakthrough bleeding for duration of implan insertion since January.  She is also taking Sprintec for PCOS symptoms and plans to continue OCPs as contraceptive method of choice.  She was referred to GYN for enlarged uterus findings on [place].  She has vaginal bleeding today; no cramping.   Review of Systems  Constitutional: Negative.   HENT: Negative.   Eyes: Negative.   Respiratory: Negative.   Cardiovascular: Negative.   Gastrointestinal: Negative.   Genitourinary: Negative.   Musculoskeletal: Negative.   Skin: Negative.   Neurological: Negative.   Endo/Heme/Allergies: Negative.   Psychiatric/Behavioral: Negative.     Patient's last menstrual period was 10/17/2015 (lmp unknown). Allergies  Allergen Reactions  . Penicillins Swelling and Rash  . Amoxicillin Swelling and Rash   Outpatient Prescriptions Prior to Visit  Medication Sig Dispense Refill  . norgestimate-ethinyl estradiol (ORTHO-CYCLEN,SPRINTEC,PREVIFEM) 0.25-35 MG-MCG tablet Take 1 tablet by mouth daily. 1 Package 11   No facility-administered medications prior to visit.     Patient Active Problem List   Diagnosis Date Noted  . Enlarged uterus 12/28/2015  . Vaginal bleeding 10/02/2015  . Screening for iron deficiency anemia 10/02/2015  . Left-sided low back pain without sciatica 05/27/2015  . Vitamin D deficiency 01/15/2015  . Goiter 11/01/2013  . Polycystic ovarian syndrome 01/22/2013  .  Morbid obesity (HCC) 01/22/2013  . Acanthosis nigricans 01/22/2013     Confidentiality was discussed with the patient and if applicable, with caregiver as well.  Patient's personal or confidential phone number: [phone]  The following portions of the patient's history were reviewed and updated as appropriate: allergies, current medications, past family history, past medical history, past social history, past surgical history and problem list.  Physical Exam:  Filed Vitals:   12/29/15 0858  BP: 144/85  Pulse: 94  Height: 4' 11.45" (1.51 m)  Weight: 190 lb 4.1 oz (86.3 kg)   BP 144/85 mmHg  Pulse 94  Ht 4' 11.45" (1.51 m)  Wt 190 lb 4.1 oz (86.3 kg)  BMI 37.85 kg/m2  LMP 10/17/2015 (LMP Unknown) Body mass index: body mass index is 37.85 kg/(m^2). Facility age limit for growth percentiles is 20 years.  Physical Exam  Constitutional: She appears well-developed. No distress.  Eyes: EOM are normal. Pupils are equal, round, and reactive to light.  Cardiovascular: Normal rate.   Pulmonary/Chest: Effort normal.  Musculoskeletal: Normal range of motion.  Neurological: She is alert.  Skin: Skin is warm and dry. No rash noted.  Nexplanon in L arm palpable prior to removal      Assessment/Plan: 1. Encounter for Nexplanon removal Risks & benefits of Nexplanon removal discussed. Consent form signed.  The patient denies any allergies to anesthetics or antiseptics.  Procedure: Pt was placed in supine position. left arm was flexed at the elbow and externally rotated so that her wrist was parallel to her ear, The device was palpated and marked. The site was cleaned with Betadine. The area surrounding the device was  covered with a sterile drape. 1% lidocaine was injected just under the device. A scalpel was used to create a small incision. The device was pushed towards the incision. Fibrous tissue surrounding the device was gradually removed from the device. The device was  removed and measured to ensure all 4 cm of device was removed. Steri-strips were used to close the incision. Pressure dressing was applied to the patient.  The patient was instructed to removed the pressure dressing in 24 hrs.  The patient was advised to move slowly from a supine to an upright position  The patient denied any concerns or complaints  The patient was instructed to schedule a follow-up appt in 1 month. The patient will be called in 1 week to address any concerns.   Follow-up:  Return in about 4 weeks (around 01/26/2016) for GYN/Reproductive Health concerns.   Medical decision-making:  > 25 minutes spent, more than 50% of appointment was spent discussing diagnosis and management of symptoms

## 2015-12-29 NOTE — Patient Instructions (Signed)
Follow-up  in 1 month. Schedule this appointment before you leave clinic today.  Your Nexplanon was removed today and is no longer preventing pregnancy.  If you have sex, remember to use condoms to prevent pregnancy and to prevent sexually transmitted infections.  Leave the outside bandage on for 24 hours.  Leave the smaller bandages on for 3-5 days or until they fall off on their own.  Keep the area clean and dry for 3-5 days.  There is usually bruising or swelling at and around the removal site for a few days to a week after the removal.  If you see redness or pus draining from the removal site, call us immediately.  We would like you to return to the clinic for a follow-up visit in 1 month.  You can call [facility] 24 hours a day with any questions or concerns.  There is always a nurse or doctor available to take your call.  Call 9-1-1 if you have a life-threatening emergency.  For anything else, please call us at [phone] before heading to the ER.  

## 2016-01-05 ENCOUNTER — Ambulatory Visit (INDEPENDENT_AMBULATORY_CARE_PROVIDER_SITE_OTHER): Payer: Medicaid Other | Admitting: Obstetrics and Gynecology

## 2016-01-05 ENCOUNTER — Encounter: Payer: Self-pay | Admitting: Obstetrics and Gynecology

## 2016-01-05 VITALS — BP 120/76 | HR 93 | Ht 59.0 in | Wt 193.0 lb

## 2016-01-05 DIAGNOSIS — N852 Hypertrophy of uterus: Secondary | ICD-10-CM

## 2016-01-05 NOTE — Progress Notes (Signed)
[facility].  Recently had nexplanon removed for abnormal bleeding.

## 2016-01-05 NOTE — Progress Notes (Signed)
Patient ID: Cynthia Waller, female   DOB: 08/11/1996, 20 y.o.   MRN: [id number] 20 yo G0 referred for the evaluation of enlarged uterus seen on ultrasound. Patient is without complaints. She denies any pelvic pain or pressure. She denies any abnormal bleeding. She is sexually active using OCP for contraception  Past Medical History  Diagnosis Date  . PCOS (polycystic ovarian syndrome)   . Eczema   . Infectious mononucleosis 06/07/2013  . Rash and nonspecific skin eruption 06/07/2013   Past Surgical History  Procedure Laterality Date  . Wisdom tooth extraction  October 7th, 2014   Family History  Problem Relation Age of Onset  . Obesity Mother   . Diabetes Maternal Grandmother   . Diabetes Paternal Grandmother    Social History  Substance Use Topics  . Smoking status: Never Smoker   . Smokeless tobacco: Never Used  . Alcohol Use: No   ROS See pertinent in HPI  Blood pressure 120/76, pulse 93, height 4\' 11"  (1.499 m), weight 193 lb (87.544 kg), last menstrual period 12/30/2015.  GENERAL: Well-developed, well-nourished female in no acute distress.  ABDOMEN: Soft, nontender, nondistended. No organomegaly. PELVIC: Normal external female genitalia. Vagina is pink and rugated.  Normal discharge. Normal appearing cervix. Uterus is normal in size.  No adnexal mass or tenderness. EXTREMITIES: No cyanosis, clubbing, or edema, 2+ distal pulses.  12/21/15 Ultrasound FINDINGS: Uterus  Measurements: 9.1 x 4.9 x 6.5 cm. The anteverted anteflexed uterus is normal in configuration and is mildly enlarged. No uterine fibroids or other myometrial abnormalities.  Endometrium  Thickness: 6 mm. Trace endometrial cavity fluid. No focal endometrial mass detected.  Right ovary  Measurements: 2.0 x 1.9 x 2.4 cm, for a right ovarian volume of 5 cc. Normal appearance/no adnexal mass.  Left ovary  Measurements: 1.8 x 1.8 x 1.4 cm, for a left ovarian volume of 2 cc (left overt  seen only on the transabdominal images). Normal appearance/no adnexal mass.  Other findings  No abnormal free fluid.  IMPRESSION: 1. Endometrial bilayer thickness 6 mm, within normal limits. Trace endometrial cavity fluid. No focal endometrial mass. 2. No uterine fibroids. Anteverted uterus is mildly enlarged. 3. Normal size and appearance of the ovaries, which do not meet sonographic criteria for polycystic ovarian syndrome at this time. No adnexal masses.   Electronically Signed  By: Delbert PhenixJason A Poff M.D.  On: 12/21/2015 09:52  A/P 20 yo with normal exam - Ultrasound results reviewed with the patient - reassurance provided - Patient to return for pap smear at the age of 20 or prn

## 2016-01-29 ENCOUNTER — Ambulatory Visit: Payer: Self-pay | Admitting: Family

## 2016-02-05 ENCOUNTER — Encounter: Payer: Self-pay | Admitting: Family

## 2016-02-05 ENCOUNTER — Ambulatory Visit (INDEPENDENT_AMBULATORY_CARE_PROVIDER_SITE_OTHER): Payer: Medicaid Other | Admitting: Family

## 2016-02-05 VITALS — BP 116/70 | HR 92 | Ht 59.84 in | Wt 188.0 lb

## 2016-02-05 DIAGNOSIS — Z3202 Encounter for pregnancy test, result negative: Secondary | ICD-10-CM

## 2016-02-05 DIAGNOSIS — E282 Polycystic ovarian syndrome: Secondary | ICD-10-CM

## 2016-02-05 DIAGNOSIS — Z130 Encounter for screening for diseases of the blood and blood-forming organs and certain disorders involving the immune mechanism: Secondary | ICD-10-CM

## 2016-02-05 LAB — POCT HEMOGLOBIN: Hemoglobin: 11.4 g/dL — AB (ref 12.2–16.2)

## 2016-02-05 LAB — POCT URINE PREGNANCY: PREG TEST UR: NEGATIVE

## 2016-02-05 NOTE — Progress Notes (Addendum)
THIS RECORD MAY CONTAIN CONFIDENTIAL INFORMATION THAT SHOULD NOT BE RELEASED WITHOUT REVIEW OF THE SERVICE PROVIDER.  Adolescent Medicine Consultation Follow-Up Visit Cynthia Waller  is a 20 y.o. female referred by Verneda SkillHacker, Caroline T, FNP here today for follow-up.    Previsit planning completed:  no  Growth Chart Viewed? no   History was provided by the patient.  PCP Confirmed?  Yes, Maxwell CaulHacker   My Chart Activated?   yes   HPI:    Things going well since last OV - she is happy that she is no longer having BTB. Reviewed her visit with Constant, MD regarding enlarged uterus. Not concerning; she has no further questions regarding this. Nexplanon removal site is well-healed. Her period came on when she expected it with every day OCP use. She denies acne or hirsutism.   Review of Systems  Constitutional: Negative.   HENT: Negative.   Eyes: Negative.   Respiratory: Negative.   Cardiovascular: Negative.   Gastrointestinal: Negative.   Genitourinary: Negative.   Musculoskeletal: Negative.   Skin: Negative.   Neurological: Negative.   Endo/Heme/Allergies: Negative.   Psychiatric/Behavioral: Negative.    She is requesting a pregnancy test while here.   No LMP recorded. Last period about 3 weeks ago with pill pack  Allergies  Allergen Reactions  . Penicillins Swelling and Rash  . Amoxicillin Swelling and Rash   Outpatient Prescriptions Prior to Visit  Medication Sig Dispense Refill  . norgestimate-ethinyl estradiol (ORTHO-CYCLEN,SPRINTEC,PREVIFEM) 0.25-35 MG-MCG tablet Take 1 tablet by mouth daily. 1 Package 11   No facility-administered medications prior to visit.     Patient Active Problem List   Diagnosis Date Noted  . Enlarged uterus 12/28/2015  . Vaginal bleeding 10/02/2015  . Screening for iron deficiency anemia 10/02/2015  . Left-sided low back pain without sciatica 05/27/2015  . Vitamin D deficiency 01/15/2015  . Goiter 11/01/2013  . Polycystic ovarian  syndrome 01/22/2013  . Morbid obesity (HCC) 01/22/2013  . Acanthosis nigricans 01/22/2013    The following portions of the patient's history were reviewed and updated as appropriate: allergies, current medications, past family history, past medical history, past social history, past surgical history and problem list.  Physical Exam:  Filed Vitals:   02/05/16 1026  BP: 116/70  Pulse: 92  Height: 4' 11.84" (1.52 m)  Weight: 188 lb (85.276 kg)   BP 116/70 mmHg  Pulse 92  Ht 4' 11.84" (1.52 m)  Wt 188 lb (85.276 kg)  BMI 36.91 kg/m2 Body mass index: body mass index is 36.91 kg/(m^2). Facility age limit for growth percentiles is 20 years.  Physical Exam  Constitutional: She is oriented to person, place, and time. She appears well-developed and well-nourished.  HENT:  Head: Normocephalic.  Neck: Thyromegaly present.  Cardiovascular: Normal rate, regular rhythm, normal heart sounds and intact distal pulses.   Pulmonary/Chest: Effort normal and breath sounds normal.  Abdominal: Soft. Bowel sounds are normal. There is no tenderness.  Musculoskeletal: Normal range of motion.  Neurological: She is alert and oriented to person, place, and time.  Skin: Skin is warm and dry.  -Acanthosis  -Well-healed nexplanon removal site L arm   Psychiatric: She has a normal mood and affect.     Assessment/Plan: 1. Polycystic ovarian syndrome -continue on Sprintec; labs up to date.  -continue to monitor periods; report new or worsening symptoms    2. Screening for iron deficiency anemia -11.4; same as last reading. Reviewed.  -should improve with resolved BTB. Continue to monitor. Diet reviewed.  -  POCT hemoglobin  3. Negative pregnancy test Negative, reviewed with pt - POCT urine pregnancy   Follow-up:  Return in about 3 months (around 05/07/2016) for PCOS management.   Medical decision-making:  >15 minutes spent, more than 50% of appointment was spent discussing diagnosis and  management of symptoms

## 2016-02-05 NOTE — Progress Notes (Signed)
Patient ID: Genia HaroldLissette Alarcon-Mendez, female   DOB: [birth year], 20 y.o.   MRN: [id number] Pre-Visit Planning  Genia HaroldLissette Alarcon-Mendez  is a 20 y.o. female referred by Verneda SkillHacker,Caroline T, FNP.   Last seen in [facility] on 12/29/15 for nexplanon removal.  Plan at last visit included removal; she was sent to GYN for enlarged uterus. Review of records from Dr. Jolayne Pantheronstant revealed normal findings, no further POC regarding uterus.    Date and Type of Previous Psych Screenings? No  Clinical Staff Visit Tasks:   - Urine GC/CT due? no - HIV Screening due?  no - Psych Screenings Due? No - fs hgb   Provider Visit Tasks: - assess bleeding, contraceptive option, any new concerns - [facility] - Pertinent Labs? Yes Lab Results  Component Value Date   HGB 11.4* 12/28/2015     >3 minutes spent reviewing records and planning for patient's visit.

## 2016-03-17 ENCOUNTER — Encounter: Payer: Self-pay | Admitting: Pediatrics

## 2016-05-06 ENCOUNTER — Ambulatory Visit: Payer: Self-pay | Admitting: Family

## 2016-05-27 ENCOUNTER — Ambulatory Visit (INDEPENDENT_AMBULATORY_CARE_PROVIDER_SITE_OTHER): Payer: Medicaid Other | Admitting: Family

## 2016-05-27 ENCOUNTER — Ambulatory Visit: Payer: Self-pay | Admitting: Family

## 2016-05-27 ENCOUNTER — Encounter: Payer: Self-pay | Admitting: Family

## 2016-05-27 VITALS — BP 125/74 | HR 94 | Ht 59.57 in | Wt 190.0 lb

## 2016-05-27 DIAGNOSIS — Z113 Encounter for screening for infections with a predominantly sexual mode of transmission: Secondary | ICD-10-CM | POA: Diagnosis not present

## 2016-05-27 DIAGNOSIS — Z3202 Encounter for pregnancy test, result negative: Secondary | ICD-10-CM | POA: Diagnosis not present

## 2016-05-27 DIAGNOSIS — E282 Polycystic ovarian syndrome: Secondary | ICD-10-CM | POA: Diagnosis not present

## 2016-05-27 LAB — CBC WITH DIFFERENTIAL/PLATELET
BASOS ABS: 66 {cells}/uL (ref 0–200)
Basophils Relative: 1 %
EOS ABS: 66 {cells}/uL (ref 15–500)
Eosinophils Relative: 1 %
HEMATOCRIT: 37.2 % (ref 35.0–45.0)
Hemoglobin: 11.8 g/dL (ref 11.7–15.5)
LYMPHS PCT: 28 %
Lymphs Abs: 1848 cells/uL (ref 850–3900)
MCH: 23.7 pg — AB (ref 27.0–33.0)
MCHC: 31.7 g/dL — AB (ref 32.0–36.0)
MCV: 74.7 fL — AB (ref 80.0–100.0)
MONOS PCT: 5 %
MPV: 8.1 fL (ref 7.5–12.5)
Monocytes Absolute: 330 cells/uL (ref 200–950)
NEUTROS PCT: 65 %
Neutro Abs: 4290 cells/uL (ref 1500–7800)
PLATELETS: 488 10*3/uL — AB (ref 140–400)
RBC: 4.98 MIL/uL (ref 3.80–5.10)
RDW: 16.5 % — AB (ref 11.0–15.0)
WBC: 6.6 10*3/uL (ref 3.8–10.8)

## 2016-05-27 LAB — COMPREHENSIVE METABOLIC PANEL
ALBUMIN: 3.9 g/dL (ref 3.6–5.1)
ALT: 6 U/L (ref 6–29)
AST: 11 U/L (ref 10–30)
Alkaline Phosphatase: 67 U/L (ref 33–115)
BUN: 6 mg/dL — ABNORMAL LOW (ref 7–25)
CALCIUM: 9.5 mg/dL (ref 8.6–10.2)
CHLORIDE: 103 mmol/L (ref 98–110)
CO2: 25 mmol/L (ref 20–31)
Creat: 0.69 mg/dL (ref 0.50–1.10)
Glucose, Bld: 102 mg/dL — ABNORMAL HIGH (ref 65–99)
Potassium: 4.3 mmol/L (ref 3.5–5.3)
Sodium: 138 mmol/L (ref 135–146)
TOTAL PROTEIN: 6.9 g/dL (ref 6.1–8.1)
Total Bilirubin: 0.3 mg/dL (ref 0.2–1.2)

## 2016-05-27 LAB — LIPID PANEL
CHOLESTEROL: 219 mg/dL — AB (ref 125–170)
HDL: 47 mg/dL (ref >=46)
LDL CALC: 121 mg/dL — AB (ref <110)
TRIGLYCERIDES: 253 mg/dL — AB (ref <150)
Total CHOL/HDL Ratio: 4.7 Ratio (ref <=5.0)
VLDL: 51 mg/dL — AB (ref <30)

## 2016-05-27 LAB — HEMOGLOBIN A1C
Hgb A1c MFr Bld: 5.1 % (ref <5.7)
Mean Plasma Glucose: 100 mg/dL

## 2016-05-27 LAB — POCT URINE PREGNANCY: Preg Test, Ur: NEGATIVE

## 2016-05-27 MED ORDER — MELATONIN 5 MG PO CHEW: 1.0000 | CHEWABLE_TABLET | Freq: Every evening | ORAL | 2 refills | Status: DC | PRN

## 2016-05-27 MED ORDER — NORGESTIMATE-ETH ESTRADIOL 0.25-35 MG-MCG PO TABS: 1.0000 | ORAL_TABLET | Freq: Every day | ORAL | 3 refills | Status: DC

## 2016-05-27 NOTE — Progress Notes (Signed)
THIS RECORD MAY CONTAIN CONFIDENTIAL INFORMATION THAT SHOULD NOT BE RELEASED WITHOUT REVIEW OF THE SERVICE PROVIDER.  Adolescent Medicine Consultation Follow-Up Visit Cynthia Waller  is a 20 y.o. female referred by Verneda Skill, FNP here today for follow-up regarding PCOS, .      Chief Complaint  Patient presents with  . Follow-up    PCOS    HPI:    -Only take OCPs - no concerns or missed pills.  -Bleeding with placebos; regular cycle described.  -no acne, no hirsutism.  -trouble falling asleep - on phone and TV; falls asleep around 1-2, then up at 9am.  -working: night shifts [employer] (4pm-10p) -appetite ok   Review of Systems  Constitutional: Negative for chills, fever and malaise/fatigue.  HENT: Negative for sore throat.   Eyes: Negative for double vision and pain.  Respiratory: Negative for shortness of breath.   Cardiovascular: Negative for chest pain and palpitations.  Gastrointestinal: Negative for abdominal pain, constipation, diarrhea, nausea and vomiting.  Genitourinary: Negative for dysuria.  Musculoskeletal: Negative for joint pain and myalgias.  Skin: Negative for rash.  Neurological: Negative for dizziness, tremors and headaches.  Endo/Heme/Allergies: Does not bruise/bleed easily.  Psychiatric/Behavioral: Negative for depression and suicidal ideas. The patient is not nervous/anxious.     Patient's last menstrual period was 05/09/2016. Allergies  Allergen Reactions  . Penicillins Swelling and Rash  . Amoxicillin Swelling and Rash   Outpatient Medications Prior to Visit  Medication Sig Dispense Refill  . norgestimate-ethinyl estradiol (ORTHO-CYCLEN,SPRINTEC,PREVIFEM) 0.25-35 MG-MCG tablet Take 1 tablet by mouth daily. 1 Package 11   No facility-administered medications prior to visit.      Patient Active Problem List   Diagnosis Date Noted  . Enlarged uterus 12/28/2015  . Vaginal bleeding 10/02/2015  . Screening for iron deficiency  anemia 10/02/2015  . Left-sided low back pain without sciatica 05/27/2015  . Vitamin D deficiency 01/15/2015  . Goiter 11/01/2013  . Polycystic ovarian syndrome 01/22/2013  . Morbid obesity (HCC) 01/22/2013  . Acanthosis nigricans 01/22/2013   Confidentiality was discussed with the patient and if applicable, with caregiver as well.    PCOS Labs & Referrals:   - Hgba1c annually if normal, every 3 months if abnormal:  Due 08/2016 - CMP annually if normal, as needed if abnormal:  Due 08/2016 - CBC if on metformin, annually if normal, as needed if abnormal:  Due 08/2016 - Lipid every 2 years if normal, annually if abnormal:  Due 08/2016 - Vitamin D annually if normal, as needed if abnormal: Due 08/2016 - Nutrition referral: PRN - BH Screening: Due today  The following portions of the patient's history were reviewed and updated as appropriate: allergies, current medications, past medical history, past social history and problem list.  Physical Exam:  Vitals:   05/27/16 1127  BP: 125/74  Pulse: 94  Weight: 190 lb (86.2 kg)  Height: 4' 11.57" (1.513 m)   BP 125/74   Pulse 94   Ht 4' 11.57" (1.513 m)   Wt 190 lb (86.2 kg)   LMP 05/09/2016   BMI 37.65 kg/m  Body mass index: body mass index is 37.65 kg/m. Growth percentile SmartLinks can only be used for patients less than 92 years old.  Physical Exam  Constitutional: She is oriented to person, place, and time. She appears well-developed and well-nourished.  HENT:  Head: Normocephalic.  Mouth/Throat: Oropharynx is clear and moist. No oropharyngeal exudate.  Eyes: EOM are normal. Pupils are equal, round, and reactive to light.  Neck: Normal range of motion.  Cardiovascular: Normal rate.   No murmur heard. Pulmonary/Chest: Effort normal.  Musculoskeletal: Normal range of motion.  Lymphadenopathy:    She has no cervical adenopathy.  Neurological: She is alert and oriented to person, place, and time.  Skin: Skin is warm and  dry. No rash noted.  Psychiatric: She has a normal mood and affect.  Vitals reviewed.   Assessment/Plan: 1. Polycystic ovarian syndrome -continue with ortho-cyclen; will check labs today.  -discussed transitioning to adult; she is planning to go back to [facility] where she has been seen before.  - norgestimate-ethinyl estradiol (ORTHO-CYCLEN,SPRINTEC,PREVIFEM) 0.25-35 MG-MCG tablet; Take 1 tablet by mouth daily. Continuous cycling  Dispense: 84 tablet; Refill: 3 - CBC with Differential/Platelet - Hemoglobin A1c - Comprehensive metabolic panel - Lipid panel - VITAMIN D 25 Hydroxy (Vit-D Deficiency, Fractures)  2. Pregnancy examination or test, negative result -negative  - POCT urine pregnancy  3. Routine screening for STI (sexually transmitted infection) -per protocol  - GC/Chlamydia Probe Amp   Follow-up:  Return if symptoms worsen or fail to improve.   Medical decision-making:  >15 minutes spent face to face with patient with more than 50% of appointment spent discussing diagnosis, management, follow-up, and reviewing the plan of care as noted above.

## 2016-05-27 NOTE — Patient Instructions (Signed)
Call and schedule a follow up as needed.  Let me know [facility]  I will call you or My Chart message you with lab results.  Let me know how the melatonin works for you please.

## 2016-05-28 LAB — VITAMIN D 25 HYDROXY (VIT D DEFICIENCY, FRACTURES): Vit D, 25-Hydroxy: 18 ng/mL — ABNORMAL LOW (ref 30–100)

## 2016-05-28 LAB — GC/CHLAMYDIA PROBE AMP
CT PROBE, AMP APTIMA: NOT DETECTED
GC Probe RNA: NOT DETECTED

## 2016-06-03 ENCOUNTER — Other Ambulatory Visit: Payer: Self-pay | Admitting: Family

## 2016-06-03 DIAGNOSIS — E559 Vitamin D deficiency, unspecified: Secondary | ICD-10-CM

## 2016-06-03 MED ORDER — VITAMIN D (ERGOCALCIFEROL) 1.25 MG (50000 UNIT) PO CAPS: 50000.0000 [IU] | ORAL_CAPSULE | ORAL | 0 refills | Status: DC

## 2017-01-25 ENCOUNTER — Ambulatory Visit: Payer: Self-pay | Admitting: Obstetrics and Gynecology

## 2017-01-31 ENCOUNTER — Ambulatory Visit (INDEPENDENT_AMBULATORY_CARE_PROVIDER_SITE_OTHER): Payer: Medicaid Other | Admitting: Obstetrics and Gynecology

## 2017-01-31 ENCOUNTER — Encounter: Payer: Self-pay | Admitting: Obstetrics and Gynecology

## 2017-01-31 ENCOUNTER — Other Ambulatory Visit (HOSPITAL_COMMUNITY)
Admission: RE | Admit: 2017-01-31 | Discharge: 2017-01-31 | Disposition: A | Discharge status: Home / Self Care | Payer: Medicaid Other | Source: Ambulatory Visit | Attending: Obstetrics and Gynecology | Admitting: Obstetrics and Gynecology

## 2017-01-31 VITALS — BP 113/75 | HR 86 | Resp 18 | Ht 59.0 in | Wt 202.0 lb

## 2017-01-31 DIAGNOSIS — Z30011 Encounter for initial prescription of contraceptive pills: Secondary | ICD-10-CM

## 2017-01-31 DIAGNOSIS — Z01419 Encounter for gynecological examination (general) (routine) without abnormal findings: Secondary | ICD-10-CM | POA: Insufficient documentation

## 2017-01-31 DIAGNOSIS — Z0000 Encounter for general adult medical examination without abnormal findings: Secondary | ICD-10-CM

## 2017-01-31 MED ORDER — NORGESTIMATE-ETH ESTRADIOL 0.25-35 MG-MCG PO TABS: 1.0000 | ORAL_TABLET | Freq: Every day | ORAL | 11 refills | Status: DC

## 2017-01-31 NOTE — Progress Notes (Signed)
Subjective:     Cynthia Waller is a 21 y.o. female G0 presenting for a comprehensive physical exam. The patient reports no problems. She is sexually active using condoms for contraception. She was previous diagnosed with PCOS and reports normal menstrual cycle with OCP. She recently discontinued the OCP due to loss of insurance. She denies any pelvic pain or abnormal discharge  Past Medical History:  Diagnosis Date  . Eczema   . Infectious mononucleosis 06/07/2013  . PCOS (polycystic ovarian syndrome)   . Rash and nonspecific skin eruption 06/07/2013   Past Surgical History:  Procedure Laterality Date  . WISDOM TOOTH EXTRACTION  October 7th, 2014   Family History  Problem Relation Age of Onset  . Obesity Mother   . Diabetes Maternal Grandmother   . Diabetes Paternal Grandmother     Social History   Social History  . Marital status: Single    Spouse name: N/A  . Number of children: N/A  . Years of education: N/A   Occupational History  . Not on file.   Social History Main Topics  . Smoking status: Never Smoker  . Smokeless tobacco: Never Used  . Alcohol use No  . Drug use: No  . Sexual activity: Not on file   Other Topics Concern  . Not on file   Social History Narrative   Currently a junior in college; studying business and accounting. Is sexually active without condoms. Has had new partner since last STI check. No vaginal discharge, pain with sex, or STI symptoms.  Does endorse alcohol use 1-2x per month without the intention of drinking to get drunk. No current or past drug use.         Health Maintenance  Topic Date Due  . HIV Screening  06/02/2010  . PAP SMEAR  06/02/2016  . INFLUENZA VACCINE  03/22/2017  . TETANUS/TDAP  06/27/2017       Review of Systems Pertinent items are noted in HPI.   Objective:      GENERAL: Well-developed, well-nourished female in no acute distress.  HEENT: Normocephalic, atraumatic. Sclerae anicteric.  NECK:  Supple. Normal thyroid.  LUNGS: Clear to auscultation bilaterally.  HEART: Regular rate and rhythm. BREASTS: Symmetric in size. No palpable masses or lymphadenopathy, skin changes, or nipple drainage. ABDOMEN: Soft, nontender, nondistended. No organomegaly. PELVIC: Normal external female genitalia. Vagina is pink and rugated.  Normal discharge. Normal appearing cervix. Uterus is normal in size. No adnexal mass or tenderness. EXTREMITIES: No cyanosis, clubbing, or edema, 2+ distal pulses.    Assessment:    Healthy female exam.      Plan:    Pap smear with cultures collected Patient will be contacted with abnormal results Patient counseled on different contraception option. She decided to restart OCP. Rx Sprintec provided RTC in 1 year or prn See After Visit Summary for Counseling Recommendations

## 2017-01-31 NOTE — Patient Instructions (Signed)
Contraception Choices Contraception (birth control) is the use of any methods or devices to prevent pregnancy. Below are some methods to help avoid pregnancy. Hormonal methods  Contraceptive implant. This is a thin, plastic tube containing progesterone hormone. It does not contain estrogen hormone. Your health care provider inserts the tube in the inner part of the upper arm. The tube can remain in place for up to 3 years. After 3 years, the implant must be removed. The implant prevents the ovaries from releasing an egg (ovulation), thickens the cervical mucus to prevent sperm from entering the uterus, and thins the lining of the inside of the uterus.  Progesterone-only injections. These injections are given every 3 months by your health care provider to prevent pregnancy. This synthetic progesterone hormone stops the ovaries from releasing eggs. It also thickens cervical mucus and changes the uterine lining. This makes it harder for sperm to survive in the uterus.  Birth control pills. These pills contain estrogen and progesterone hormone. They work by preventing the ovaries from releasing eggs (ovulation). They also cause the cervical mucus to thicken, preventing the sperm from entering the uterus. Birth control pills are prescribed by a health care provider.Birth control pills can also be used to treat heavy periods.  Minipill. This type of birth control pill contains only the progesterone hormone. They are taken every day of each month and must be prescribed by your health care provider.  Birth control patch. The patch contains hormones similar to those in birth control pills. It must be changed once a week and is prescribed by a health care provider.  Vaginal ring. The ring contains hormones similar to those in birth control pills. It is left in the vagina for 3 weeks, removed for 1 week, and then a new one is put back in place. The patient must be comfortable inserting and removing the ring from  the vagina.A health care provider's prescription is necessary.  Emergency contraception. Emergency contraceptives prevent pregnancy after unprotected sexual intercourse. This pill can be taken right after sex or up to 5 days after unprotected sex. It is most effective the sooner you take the pills after having sexual intercourse. Most emergency contraceptive pills are available without a prescription. Check with your pharmacist. Do not use emergency contraception as your only form of birth control. Barrier methods  Female condom. This is a thin sheath (latex or rubber) that is worn over the penis during sexual intercourse. It can be used with spermicide to increase effectiveness.  Female condom. This is a soft, loose-fitting sheath that is put into the vagina before sexual intercourse.  Diaphragm. This is a soft, latex, dome-shaped barrier that must be fitted by a health care provider. It is inserted into the vagina, along with a spermicidal jelly. It is inserted before intercourse. The diaphragm should be left in the vagina for 6 to 8 hours after intercourse.  Cervical cap. This is a round, soft, latex or plastic cup that fits over the cervix and must be fitted by a health care provider. The cap can be left in place for up to 48 hours after intercourse.  Sponge. This is a soft, circular piece of polyurethane foam. The sponge has spermicide in it. It is inserted into the vagina after wetting it and before sexual intercourse.  Spermicides. These are chemicals that kill or block sperm from entering the cervix and uterus. They come in the form of creams, jellies, suppositories, foam, or tablets. They do not require a prescription. They   are inserted into the vagina with an applicator before having sexual intercourse. The process must be repeated every time you have sexual intercourse. Intrauterine contraception  Intrauterine device (IUD). This is a T-shaped device that is put in a woman's uterus during  a menstrual period to prevent pregnancy. There are 2 types: ? Copper IUD. This type of IUD is wrapped in copper wire and is placed inside the uterus. Copper makes the uterus and fallopian tubes produce a fluid that kills sperm. It can stay in place for 10 years. ? Hormone IUD. This type of IUD contains the hormone progestin (synthetic progesterone). The hormone thickens the cervical mucus and prevents sperm from entering the uterus, and it also thins the uterine lining to prevent implantation of a fertilized egg. The hormone can weaken or kill the sperm that get into the uterus. It can stay in place for 3-5 years, depending on which type of IUD is used. Permanent methods of contraception  Female tubal ligation. This is when the woman's fallopian tubes are surgically sealed, tied, or blocked to prevent the egg from traveling to the uterus.  Hysteroscopic sterilization. This involves placing a small coil or insert into each fallopian tube. Your doctor uses a technique called hysteroscopy to do the procedure. The device causes scar tissue to form. This results in permanent blockage of the fallopian tubes, so the sperm cannot fertilize the egg. It takes about 3 months after the procedure for the tubes to become blocked. You must use another form of birth control for these 3 months.  Female sterilization. This is when the female has the tubes that carry sperm tied off (vasectomy).This blocks sperm from entering the vagina during sexual intercourse. After the procedure, the man can still ejaculate fluid (semen). Natural planning methods  Natural family planning. This is not having sexual intercourse or using a barrier method (condom, diaphragm, cervical cap) on days the woman could become pregnant.  Calendar method. This is keeping track of the length of each menstrual cycle and identifying when you are fertile.  Ovulation method. This is avoiding sexual intercourse during ovulation.  Symptothermal method.  This is avoiding sexual intercourse during ovulation, using a thermometer and ovulation symptoms.  Post-ovulation method. This is timing sexual intercourse after you have ovulated. Regardless of which type or method of contraception you choose, it is important that you use condoms to protect against the transmission of sexually transmitted infections (STIs). Talk with your health care provider about which form of contraception is most appropriate for you. This information is not intended to replace advice given to you by your health care provider. Make sure you discuss any questions you have with your health care provider. Document Released: 08/08/2005 Document Revised: 01/14/2016 Document Reviewed: 01/31/2013 Elsevier Interactive Patient Education  2017 [employer]  

## 2017-02-02 LAB — CYTOLOGY - PAP
CHLAMYDIA, DNA PROBE: NEGATIVE
DIAGNOSIS: NEGATIVE
NEISSERIA GONORRHEA: NEGATIVE

## 2017-11-22 ENCOUNTER — Encounter: Payer: Self-pay | Admitting: Radiology

## 2018-01-01 ENCOUNTER — Other Ambulatory Visit: Payer: Self-pay | Admitting: Family Medicine

## 2018-01-01 DIAGNOSIS — E049 Nontoxic goiter, unspecified: Secondary | ICD-10-CM

## 2018-01-24 ENCOUNTER — Ambulatory Visit
Admission: RE | Admit: 2018-01-24 | Discharge: 2018-01-24 | Disposition: A | Discharge status: Home / Self Care | Payer: 59 | Source: Ambulatory Visit | Attending: Family Medicine | Admitting: Family Medicine

## 2018-01-24 DIAGNOSIS — E049 Nontoxic goiter, unspecified: Secondary | ICD-10-CM

## 2019-12-24 ENCOUNTER — Other Ambulatory Visit: Payer: Self-pay

## 2020-01-29 ENCOUNTER — Ambulatory Visit (INDEPENDENT_AMBULATORY_CARE_PROVIDER_SITE_OTHER): Payer: 59 | Admitting: Family Medicine

## 2020-01-29 ENCOUNTER — Other Ambulatory Visit: Payer: Self-pay

## 2020-01-29 ENCOUNTER — Encounter: Payer: Self-pay | Admitting: Family Medicine

## 2020-01-29 ENCOUNTER — Other Ambulatory Visit (HOSPITAL_COMMUNITY)
Admission: RE | Admit: 2020-01-29 | Discharge: 2020-01-29 | Disposition: A | Discharge status: Home / Self Care | Payer: 59 | Source: Ambulatory Visit | Attending: Family Medicine | Admitting: Family Medicine

## 2020-01-29 VITALS — BP 134/90 | HR 92 | Temp 97.7°F | Ht 59.5 in | Wt 223.4 lb

## 2020-01-29 DIAGNOSIS — Z124 Encounter for screening for malignant neoplasm of cervix: Secondary | ICD-10-CM | POA: Insufficient documentation

## 2020-01-29 DIAGNOSIS — Z0000 Encounter for general adult medical examination without abnormal findings: Secondary | ICD-10-CM | POA: Diagnosis not present

## 2020-01-29 DIAGNOSIS — E559 Vitamin D deficiency, unspecified: Secondary | ICD-10-CM | POA: Diagnosis not present

## 2020-01-29 DIAGNOSIS — Z3040 Encounter for surveillance of contraceptives, unspecified: Secondary | ICD-10-CM

## 2020-01-29 LAB — LIPID PANEL
Cholesterol: 192 mg/dL (ref 0–200)
HDL: 38.1 mg/dL — ABNORMAL LOW (ref >39.00)
NonHDL: 153.51
Total CHOL/HDL Ratio: 5
Triglycerides: 230 mg/dL — ABNORMAL HIGH (ref 0.0–149.0)
VLDL: 46 mg/dL — ABNORMAL HIGH (ref 0.0–40.0)

## 2020-01-29 LAB — LDL CHOLESTEROL, DIRECT: Direct LDL: 115 mg/dL

## 2020-01-29 LAB — HEMOGLOBIN A1C: Hgb A1c MFr Bld: 5.6 % (ref 4.6–6.5)

## 2020-01-29 LAB — VITAMIN D 25 HYDROXY (VIT D DEFICIENCY, FRACTURES): VITD: 13.68 ng/mL — ABNORMAL LOW (ref 30.00–100.00)

## 2020-01-29 LAB — TSH: TSH: 2.52 u[IU]/mL (ref 0.35–4.50)

## 2020-01-29 MED ORDER — XULANE 150-35 MCG/24HR TD PTWK: 1.0000 | MEDICATED_PATCH | TRANSDERMAL | 4 refills | Status: DC

## 2020-01-29 NOTE — Patient Instructions (Signed)
Good to meet you today  Please get your Tdap and let me know when so I can put in computer  A resource that I like is [email or web address]- Dr. Wylene Simmer, Dr. Shanda Howells, Dr. Allyson Sabal  Here are some guidelines to help you with meal planning -  Avoid all processed and packaged foods (bread, pasta, crackers, chips, etc) and beverages containing calories.  Avoid added sugars and excessive natural sugars.  Attention to how you feel if you consume artificial sweeteners.  Do they make you more hungry or raise your blood sugar?  With every meal and snack, aim to get 20 g of protein (3 ounces of meat, 4 ounces of fish, 3 eggs, protein powder, 1 cup [place] yogurt, 1 cup cottage cheese, etc.)  Increase fiber in the form of non-starchy vegetables.  These help you feel full with very little carbohydrates and are good for gut health.  Eat 1 serving healthy carb per meal- 1/2 cup brown rice, beans, potato, corn- pay attention to whether or not this significantly raises your blood sugar. If it does, reduce the frequency you consume these.   Eat 2-3 servings of lower sugar fruits daily.  This includes berries, apples, oranges, peaches, pears, one half banana.  Have small amounts of good fats such as avocado, nuts, olive oil, nut butters, olives.  Add a little cheese to your salads to make them tasty.

## 2020-01-29 NOTE — Progress Notes (Signed)
Subjective:    Patient ID: Cynthia Waller, female    DOB: 1995/10/20, 24 y.o.   MRN: [id number]  HPI Chief Complaint  Patient presents with   New Patient (Initial Visit)    Refill on birth control meds   This is a 24 yo female who presents today for refill of Xulane contraception patch. Recently got married. Works as a Occupational psychologist for [employer]. Has a dog.     Last CPE- several years Pap- 01/31/2017 Tdap- 06/30/1999 Flu- annual Eye- last year Dental- regular Exercise- walking dog Sleep- 8 hours Diet- cooks at home. Eats 2-3 meals a day, typical breakfast- eggs, ham, bread. Lunch- rice, tortilla, meat, spaghetti, Dinner- same as lunch. No snacks. Drinks 2-3 soda, juice a day. She and her husband have been talking    Review of Systems  Constitutional: Negative.   HENT: Negative.   Eyes: Negative.   Respiratory: Negative.   Cardiovascular: Negative.   Gastrointestinal: Negative.   Endocrine: Negative.   Genitourinary: Negative.   Musculoskeletal: Negative.   Skin: Negative.   Allergic/Immunologic: Negative.   Neurological: Negative.   Hematological: Negative.   Psychiatric/Behavioral: Negative.        Objective:   Physical Exam Vitals reviewed. Exam conducted with a chaperone present.  Constitutional:      General: She is not in acute distress.    Appearance: Normal appearance. She is obese. She is not ill-appearing, toxic-appearing or diaphoretic.  HENT:     Head: Normocephalic and atraumatic.     Right Ear: Tympanic membrane, ear canal and external ear normal.     Left Ear: Tympanic membrane, ear canal and external ear normal.     Nose: Nose normal.     Mouth/Throat:     Mouth: Mucous membranes are moist.     Pharynx: Oropharynx is clear.  Eyes:     Conjunctiva/sclera: Conjunctivae normal.  Cardiovascular:     Rate and Rhythm: Normal rate and regular rhythm.     Pulses: Normal pulses.     Heart sounds: Normal heart sounds.  Pulmonary:      Effort: Pulmonary effort is normal.     Breath sounds: Normal breath sounds.  Abdominal:     General: Abdomen is flat. Bowel sounds are normal. There is no distension.     Palpations: Abdomen is soft. There is no mass.     Tenderness: There is no abdominal tenderness. There is no rebound.     Hernia: No hernia is present.  Genitourinary:    General: Normal vulva.     Exam position: Supine.     Pubic Area: No rash.   Musculoskeletal:     Cervical back: Normal range of motion and neck supple.  Neurological:     Mental Status: She is alert.       BP 134/90 (BP Location: Left Arm, Patient Position: Sitting, Cuff Size: Large)    Pulse 92    Temp 97.7 F (36.5 C) (Temporal)    Ht 4' 11.5" (1.511 m)    Wt 223 lb 6.4 oz (101.3 kg)    SpO2 98%    BMI 44.37 kg/m  Wt Readings from Last 3 Encounters:  01/29/20 223 lb 6.4 oz (101.3 kg)  01/31/17 202 lb (91.6 kg)  05/27/16 190 lb (86.2 kg)   Depression screen PHQ 2/9 01/29/2020  Decreased Interest 0  Down, Depressed, Hopeless 0  PHQ - 2 Score 0       Assessment & Plan:  1. Annual  physical exam - Discussed and encouraged healthy lifestyle choices- adequate sleep, regular exercise, stress management and healthy food choices.  -Available records reviewed in EMR  2. Screening for cervical cancer - Cytology - PAP([place])  3. Morbid obesity (HCC) - Vitamin D, 25-hydroxy - Lipid Panel - Hemoglobin A1c - TSH -Patient and husband are interested in eating better. Provided written and verbal information regarding nutrition.  4. Vitamin D deficiency - Vitamin D, 25-hydroxy  5. Encounter for refill of prescription for contraception - norelgestromin-ethinyl estradiol [employer]) 150-35 MCG/24HR transdermal patch; Place 1 patch onto the skin once a week.  Dispense: 9 patch; Refill: 4  -Follow-up in 1 year, sooner if abnormal lab results  This visit occurred during the SARS-CoV-2 public health emergency.  Safety protocols were in place,  including screening questions prior to the visit, additional usage of staff PPE, and extensive cleaning of exam room while observing appropriate contact time as indicated for disinfecting solutions.    Olean Ree, FNP-BC  [facility], [facility]  01/30/2020 5:05 PM

## 2020-01-30 ENCOUNTER — Encounter: Payer: Self-pay | Admitting: Family Medicine

## 2020-01-30 LAB — CYTOLOGY - PAP
Chlamydia: NEGATIVE
Comment: NEGATIVE
Comment: NEGATIVE
Comment: NORMAL
Diagnosis: NEGATIVE
Neisseria Gonorrhea: NEGATIVE
Trichomonas: NEGATIVE

## 2020-02-04 ENCOUNTER — Encounter: Payer: Self-pay | Admitting: Family Medicine

## 2020-02-05 ENCOUNTER — Other Ambulatory Visit: Payer: Self-pay | Admitting: Family Medicine

## 2020-02-05 MED ORDER — VITAMIN D3 1.25 MG (50000 UT) PO TABS: 1.0000 | ORAL_TABLET | ORAL | 3 refills | Status: AC

## 2020-02-05 NOTE — Telephone Encounter (Signed)
Tdap booster documented.   Please advise on Vit D, thanks.

## 2020-05-01 ENCOUNTER — Encounter: Payer: Self-pay | Admitting: Family Medicine

## 2020-12-07 ENCOUNTER — Other Ambulatory Visit: Payer: Self-pay

## 2020-12-07 DIAGNOSIS — Z3040 Encounter for surveillance of contraceptives, unspecified: Secondary | ICD-10-CM

## 2020-12-07 NOTE — Telephone Encounter (Signed)
Pharmacy requests refill on: Xulane Patches   LAST REFILL: 01/29/2020 (Q-9 patches, R-4)  LAST OV: 01/29/2020 NEXT OV: Not Scheduled  PHARMACY: [id number] [place], [place]

## 2020-12-09 MED ORDER — XULANE 150-35 MCG/24HR TD PTWK: 1.0000 | MEDICATED_PATCH | TRANSDERMAL | 4 refills | Status: AC

## 2021-05-28 ENCOUNTER — Encounter (HOSPITAL_COMMUNITY): Payer: Self-pay | Admitting: Emergency Medicine

## 2021-05-28 ENCOUNTER — Emergency Department (HOSPITAL_COMMUNITY)
Admission: EM | Admit: 2021-05-28 | Discharge: 2021-05-29 | Disposition: A | Discharge status: Home / Self Care | Payer: 59 | Source: Home / Self Care | Attending: Emergency Medicine | Admitting: Emergency Medicine

## 2021-05-28 ENCOUNTER — Emergency Department (HOSPITAL_COMMUNITY)
Admission: EM | Admit: 2021-05-28 | Discharge: 2021-05-28 | Disposition: A | Discharge status: Home / Self Care | Payer: 59 | Attending: Student | Admitting: Student

## 2021-05-28 ENCOUNTER — Telehealth: Payer: Self-pay | Admitting: Family Medicine

## 2021-05-28 ENCOUNTER — Other Ambulatory Visit: Payer: Self-pay

## 2021-05-28 DIAGNOSIS — M5432 Sciatica, left side: Secondary | ICD-10-CM

## 2021-05-28 DIAGNOSIS — M25561 Pain in right knee: Secondary | ICD-10-CM | POA: Diagnosis not present

## 2021-05-28 DIAGNOSIS — R202 Paresthesia of skin: Secondary | ICD-10-CM | POA: Diagnosis not present

## 2021-05-28 DIAGNOSIS — M25562 Pain in left knee: Secondary | ICD-10-CM | POA: Diagnosis not present

## 2021-05-28 DIAGNOSIS — Z5321 Procedure and treatment not carried out due to patient leaving prior to being seen by health care provider: Secondary | ICD-10-CM | POA: Insufficient documentation

## 2021-05-28 DIAGNOSIS — M533 Sacrococcygeal disorders, not elsewhere classified: Secondary | ICD-10-CM | POA: Insufficient documentation

## 2021-05-28 DIAGNOSIS — M25552 Pain in left hip: Secondary | ICD-10-CM | POA: Diagnosis not present

## 2021-05-28 DIAGNOSIS — Z87891 Personal history of nicotine dependence: Secondary | ICD-10-CM | POA: Insufficient documentation

## 2021-05-28 MED ORDER — IBUPROFEN 200 MG PO TABS
600.0000 mg | ORAL_TABLET | Freq: Once | ORAL | Status: AC
  Administered 2021-05-28: 600 mg via ORAL
  Filled 2021-05-28: qty 3

## 2021-05-28 MED ORDER — CYCLOBENZAPRINE HCL 10 MG PO TABS
10.0000 mg | ORAL_TABLET | Freq: Once | ORAL | Status: AC
  Administered 2021-05-28: 10 mg via ORAL
  Filled 2021-05-28: qty 1

## 2021-05-28 NOTE — ED Triage Notes (Signed)
Patient here from [facility] reporting bilateral leg pain that started on Tues. Increasingly worse today with numbness from groin down left leg down to toes. States left side is worse right now. Denies injury. OTC meds with no relief.

## 2021-05-28 NOTE — ED Triage Notes (Signed)
Pt presents for eval of bilateral leg pain x a few days and then today developed numbness in L leg that started at 1030am. Denies injury to legs or back. Taking OTC meds without relief.

## 2021-05-28 NOTE — ED Provider Notes (Signed)
Emergency Medicine Provider Triage Evaluation Note  Cynthia Waller , a 25 y.o. female  was evaluated in triage.  Pt complains of left leg pain.  She reports that she had back pain last week but has been going to a chiropractor and that has resolved.  She reports that her left leg gets numb when she walks.  She states that she has had sciatica before but nothing this severe.   She took one Excedrin, one aleve and one Advil at 11am with out relief.    No fevers, no history of cancer, no IVDA , no trauma.    No changes to bowel or bladder function.  She reports that the pain is mostly in her calf's bilaterally.   Review of Systems  Positive: Pain in both legs Negative: Leg swelling, Back pain, changes to bowel or bladder function.   Physical Exam  BP (!) 167/107 (BP Location: Left Arm)   Pulse (!) 109   Temp 98 F (36.7 C) (Oral)   Resp 20   Ht 4\' 11"  (1.499 m)   Wt 102.1 kg   SpO2 96%   BMI 45.44 kg/m  Gen:   Awake, no distress   Resp:  Normal effort  MSK:   Moves extremities without difficulty  Other:  2+ DP/pt pulses bilaterally.  Sensation intact to light touch to bilateral legs.  Able to move ankles and feet with out difficulty.   Medical Decision Making  Medically screening exam initiated at 10:48 PM.  Appropriate orders placed.  Cynthia Waller was informed that the remainder of the evaluation will be completed by another provider, this initial triage assessment does not replace that evaluation, and the importance of remaining in the ED until their evaluation is complete.     05/28/21 2300    [place], 07/28/21, MD 05/28/21 2342

## 2021-05-28 NOTE — ED Provider Notes (Signed)
Emergency Medicine Provider Triage Evaluation Note  Cynthia Waller , a 25 y.o. female  was evaluated in triage.  Pt complains of leg numbness.  Review of Systems  Positive: Back pain a week ago, bilateral knees pain, numbness from L buttock down to leg today Negative: Fever, bowel/bladder incontinence, saddle anesthesia  Physical Exam  BP (!) 173/108 (BP Location: Right Arm)   Pulse (!) 118   Temp 98.9 F (37.2 C) (Oral)   Resp 14   SpO2 100%  Gen:   Awake, no distress   Resp:  Normal effort  MSK:   Moves extremities without difficulty  Other:  No tenderness to lumbar spine.  5/5 strength to BLE with intact pulses  Medical Decision Making  Medically screening exam initiated at 5:30 PM.  Appropriate orders placed.  Jolisa Alarcon-Mendez was informed that the remainder of the evaluation will be completed by another provider, this initial triage assessment does not replace that evaluation, and the importance of remaining in the ED until their evaluation is complete.  Back pain last week which resolved.  Has had bilateral knee pain.  Now having numbness throughout L leg.    Fayrene Helper, PA-C 05/28/21 1732    Wynetta Fines, MD 05/28/21 [phone]

## 2021-05-28 NOTE — Telephone Encounter (Signed)
Per chart review pt is at [facility] ED. Pt has 06/01/21 TOC appt with [name]. Sending note to [facility] and [employer] NP.

## 2021-05-28 NOTE — Telephone Encounter (Signed)
[facility] - Client TELEPHONE ADVICE RECORD [facility] Patient Name: Cynthia Waller Gender: Female DOB: Sep 30, 1995 Age: 25 Y 11 M 24 D Return Phone Number: [phone] (Primary), [phone] (Secondary) Address: City/ State/ Zip: [place]  [address] Client [facility] - Client Client Site [facility] - Day Physician Audria Nine- NP Contact Type Call Who Is Calling Patient / Member / Family / Caregiver Call Type Triage / Clinical Relationship To Patient Self Return Phone Number [phone] (Primary) Chief Complaint Leg Pain Reason for Call Symptomatic / Request for Health Information Initial Comment Caller was transfer from [facility] clinic, she is currently experiencing pain on both legs and when she stands up feels like getting a cramp with no end. Translation No Nurse Assessment Nurse: [place], RN, Marchelle Folks Date/Time (Eastern Time): 05/28/2021 2:47:11 PM Confirm and document reason for call. If symptomatic, describe symptoms. ---caller states from the knee down and back of thighs she has pain. she cannot stand for long. right hurts more than the left. sitting on her bed she still has pain. Does the patient have any new or worsening symptoms? ---Yes Will a triage be completed? ---Yes Related visit to physician within the last 2 weeks? ---N/A Does the PT have any chronic conditions? (i.e. diabetes, asthma, this includes High risk factors for pregnancy, etc.) ---No Is the patient pregnant or possibly pregnant? (Ask all females between the ages of 51-55) ---No Is this a behavioral health or substance abuse call? ---No Guidelines Guideline Title Affirmed Question Affirmed Notes Nurse Date/Time (Eastern Time) Back Pain Numbness in groin or rectal area (i.e., loss of sensation) [place], RN, Marchelle Folks 05/28/2021 2:49:54 PM Disp. Time Lamount Cohen Time) Disposition Final  User 05/28/2021 2:53:08 PM Go to ED Now Yes Humfleet, RN, Marchelle Folks PLEASE NOTE: All timestamps contained within this report are represented as [place] Standard Time. CONFIDENTIALTY NOTICE: This fax transmission is intended only for the addressee. It contains information that is legally privileged, confidential or otherwise protected from use or disclosure. If you are not the intended recipient, you are strictly prohibited from reviewing, disclosing, copying using or disseminating any of this information or taking any action in reliance on or regarding this information. If you have received this fax in error, please notify us immediately by telephone so that we can arrange for its return to [place]. Phone: [phone], Toll-Free: [phone], Fax: [phone] Page: 2 of 2 Call Id: [id number] Caller Disagree/Comply Comply Caller Understands Yes PreDisposition InappropriateToAsk Care Advice Given Per Guideline GO TO ED NOW: * You need to be seen in the Emergency Department. * Go to the ED at ___________ Hospital. * Leave now. Drive carefully. CARE ADVICE given per Back Pain (Adult) guideline. Referrals [facility] - E

## 2021-05-29 MED ORDER — HYDROMORPHONE HCL 2 MG/ML IJ SOLN
2.0000 mg | Freq: Once | INTRAMUSCULAR | Status: AC
  Administered 2021-05-29: 2 mg via INTRAMUSCULAR
  Filled 2021-05-29: qty 1

## 2021-05-29 MED ORDER — PREDNISONE 20 MG PO TABS
60.0000 mg | ORAL_TABLET | Freq: Once | ORAL | Status: AC
  Administered 2021-05-29: 60 mg via ORAL
  Filled 2021-05-29: qty 3

## 2021-05-29 MED ORDER — HYDROCODONE-ACETAMINOPHEN 5-325 MG PO TABS: 1.0000 | ORAL_TABLET | Freq: Four times a day (QID) | ORAL | 0 refills | Status: DC | PRN

## 2021-05-29 MED ORDER — PREDNISONE 20 MG PO TABS
ORAL_TABLET | ORAL | 0 refills | Status: DC
Start: 2021-05-29 — End: 2021-06-07

## 2021-05-29 NOTE — Discharge Instructions (Addendum)
Begin taking prednisone as prescribed.  Begin taking Percocet as prescribed as needed for pain.  Rest.  Follow-up with your primary doctor on Tuesday as scheduled, and return to the ER if symptoms significantly worsen or change.

## 2021-05-29 NOTE — ED Provider Notes (Signed)
[facility]-EMERGENCY DEPT Provider Note   CSN: [id number] Arrival date & time: 05/28/21  2124     History Chief Complaint  Patient presents with   Leg Pain   Groin Pain    Cynthia Waller is a 25 y.o. female.  Patient is a 25 year old female with past medical history of eczema and polycystic ovaries.  She presents today with complaints of radicular left leg pain.  This has been worsening over the past week.  She is seeing the chiropractor and had some sort of manipulation performed, which seemed to make things worse.  She is complaining of pain starting at her buttock radiating all the way down to her foot.  The leg at times feels numb, but she denies any weakness.  She denies any bowel or bladder complaints.  She has trouble walking, but this limitation is secondary to pain.  She reports having sciatica several years ago and this feels similar.  The history is provided by the patient.  Leg Pain Location:  Buttock and leg Time since incident:  1 week Buttock location:  L buttock Pain details:    Quality:  Shooting   Radiates to:  L leg   Severity:  Severe   Onset quality:  Gradual   Duration:  1 week   Timing:  Constant   Progression:  Worsening Groin Pain      Past Medical History:  Diagnosis Date   Eczema    Infectious mononucleosis 06/07/2013   PCOS (polycystic ovarian syndrome)    Rash and nonspecific skin eruption 06/07/2013    Patient Active Problem List   Diagnosis Date Noted   Enlarged uterus 12/28/2015   Vaginal bleeding 10/02/2015   Screening for iron deficiency anemia 10/02/2015   Left-sided low back pain without sciatica 05/27/2015   Vitamin D deficiency 01/15/2015   Goiter 11/01/2013   Polycystic ovarian syndrome 01/22/2013   Morbid obesity (HCC) 01/22/2013   Acanthosis nigricans 01/22/2013    Past Surgical History:  Procedure Laterality Date   WISDOM TOOTH EXTRACTION  October 7th, 2014     OB History      Gravida  0   Para  0   Term  0   Preterm  0   AB  0   Living  0      SAB  0   IAB  0   Ectopic  0   Multiple  0   Live Births  0           Family History  Problem Relation Age of Onset   Obesity Mother    Diabetes Maternal Grandmother    Diabetes Paternal Grandmother     Social History   Tobacco Use   Smoking status: Never   Smokeless tobacco: Never  Vaping Use   Vaping Use: Former   Quit date: 01/28/2017   Devices: jule  Substance Use Topics   Alcohol use: No    Alcohol/week: 0.0 standard drinks    Comment: social   Drug use: No    Home Medications Prior to Admission medications   Medication Sig Start Date End Date Taking? Authorizing Provider  Cholecalciferol (VITAMIN D3) 1.25 MG (50000 UT) TABS Take 1 tablet by mouth every 7 (seven) days. 02/05/20   Emi Belfast, FNP  norelgestromin-ethinyl estradiol [employer] [facility]ico) 150-35 MCG/24HR transdermal patch Place 1 patch onto the skin once a week. 12/09/20   Worthy Rancher B, FNP    Allergies    Penicillins and Amoxicillin  Review  of Systems   Review of Systems  All other systems reviewed and are negative.  Physical Exam Updated Vital Signs BP 124/74   Pulse (!) 107   Temp 99.1 F (37.3 C)   Resp 18   Ht 4\' 11"  (1.499 m)   Wt 102.1 kg   SpO2 98%   BMI 45.44 kg/m   Physical Exam Vitals and nursing note reviewed.  Constitutional:      General: She is not in acute distress.    Appearance: Normal appearance. She is not ill-appearing.  HENT:     Head: Normocephalic and atraumatic.  Pulmonary:     Effort: Pulmonary effort is normal.  Musculoskeletal:     Comments: There is tenderness to palpation in the left buttock and left lateral hip.  Sensation is intact throughout both legs.  DTRs are trace and symmetrical in the patellar and Achilles tendons bilaterally.  Strength is 5 out of 5 in both lower extremities.  Skin:    General: Skin is warm and dry.  Neurological:     Mental Status: She is  alert.    ED Results / Procedures / Treatments   Labs (all labs ordered are listed, but only abnormal results are displayed) Labs Reviewed - No data to display  EKG None  Radiology No results found.  Procedures Procedures   Medications Ordered in ED Medications  HYDROmorphone (DILAUDID) injection 2 mg (has no administration in time range)  predniSONE (DELTASONE) tablet 60 mg (has no administration in time range)  cyclobenzaprine (FLEXERIL) tablet 10 mg (10 mg Oral Given 05/28/21 2325)  ibuprofen (ADVIL) tablet 600 mg (600 mg Oral Given 05/28/21 2326)    ED Course  I have reviewed the triage vital signs and the nursing notes.  Pertinent labs & imaging results that were available during my care of the patient were reviewed by me and considered in my medical decision making (see chart for details).    MDM Rules/Calculators/A&P  Patient presenting here with complaints of severe pain in her left leg as described in the HPI.  I suspect sciatica or lumbar radiculopathy.  There are no red flags which would suggest an emergent situation.  Her strength and reflexes are symmetrical and there are no bowel or bladder complaints.  Patient will be given IM pain medication along with oral prednisone.  Patient to be discharged with follow-up with her PCP on Tuesday.  If she is not improving, physical therapy or imaging studies can be considered at that time, but I see no indication for emergent MRI.  Final Clinical Impression(s) / ED Diagnoses Final diagnoses:  None    Rx / DC Orders ED Discharge Orders     None        Sunday, MD 05/29/21 [phone]

## 2021-05-31 NOTE — Telephone Encounter (Signed)
I spoke with pt; pt was seen 05/28/21 at [facility] ED and given hydrocodone apap 5-325 mg and prednisone. Pt said she usually takes one of hydrocodone apap but sometimes she will take 2 hydrocodone apap 5-325 mg as prescribed and it does help pain for couple of hrs but does not last 6 hrs. Pt said no redness to lt foot or leg but pt said has numbness and barely lift lt foot and leg. Pt has slight swelling to lt foot and leg.Pt said when gets up to walk pt pain level for lt leg goes to 6 and pt can only walk short distance before having to lay down and get off feet. Pt is also taking OTC restless leg medication.No CP or SOB. Pt already has appt on 06/01/21 at 8 AM with [name] as TOC visit. Pt does not want to go back to ED due to long wait time. ED precautions given and pt voiced understanding. Sending note to [name] and Anastasiya CMA. Will also teams anastasiya.

## 2021-05-31 NOTE — Telephone Encounter (Signed)
Anastasiya CMA teams me with this info.  [4:23 PM] Hopkins, Simona Huh, Matt said if patient is not able to move her extremity and has numbness she needs to go to ER, he is not able to help her until he can see her but that is worrisome to wait. not sure if you can re interate that to her maybe  [4:23 PM] Nieshia, [name] Nov 29, 1995  I spoke with pt and she said pt was experiencing difficulty in walking and numbness when was at [name] on 05/28/21 and pt did not feel that either ED addressed those problems and pt said not to cancel her appt with [name] on 06/01/21 because pt does not want to wait at ED again today and pt does not have transportation to go to ED now. Pt said "everyone is at work". Sending note to [name] and Anastasiya CMA and will also teams Anastasiya. Reviewed again ED and UC precautions and recommendations and pt voiced understanding but thinks she will wait for appt on 06/01/21.

## 2021-05-31 NOTE — Telephone Encounter (Signed)
Pt called in stated still having cramps in both legs . Would like to get something for pain . Medication she got in the ED is not working Please advise [phone]

## 2021-06-01 ENCOUNTER — Ambulatory Visit (INDEPENDENT_AMBULATORY_CARE_PROVIDER_SITE_OTHER): Payer: 59 | Admitting: Nurse Practitioner

## 2021-06-01 ENCOUNTER — Encounter: Payer: Self-pay | Admitting: Nurse Practitioner

## 2021-06-01 ENCOUNTER — Other Ambulatory Visit: Payer: Self-pay

## 2021-06-01 VITALS — BP 138/96 | HR 110 | Temp 97.7°F | Resp 10 | Ht 59.5 in | Wt 231.1 lb

## 2021-06-01 DIAGNOSIS — Z23 Encounter for immunization: Secondary | ICD-10-CM

## 2021-06-01 DIAGNOSIS — M545 Low back pain, unspecified: Secondary | ICD-10-CM

## 2021-06-01 DIAGNOSIS — R202 Paresthesia of skin: Secondary | ICD-10-CM

## 2021-06-01 DIAGNOSIS — Z6841 Body Mass Index (BMI) 40.0 and over, adult: Secondary | ICD-10-CM | POA: Diagnosis not present

## 2021-06-01 DIAGNOSIS — R252 Cramp and spasm: Secondary | ICD-10-CM | POA: Diagnosis not present

## 2021-06-01 DIAGNOSIS — Z0000 Encounter for general adult medical examination without abnormal findings: Secondary | ICD-10-CM | POA: Insufficient documentation

## 2021-06-01 DIAGNOSIS — E049 Nontoxic goiter, unspecified: Secondary | ICD-10-CM

## 2021-06-01 LAB — COMPREHENSIVE METABOLIC PANEL
ALT: 29 U/L (ref 0–35)
AST: 55 U/L — ABNORMAL HIGH (ref 0–37)
Albumin: 4.2 g/dL (ref 3.5–5.2)
Alkaline Phosphatase: 67 U/L (ref 39–117)
BUN: 18 mg/dL (ref 6–23)
CO2: 27 mEq/L (ref 19–32)
Calcium: 9.8 mg/dL (ref 8.4–10.5)
Chloride: 101 mEq/L (ref 96–112)
Creatinine, Ser: 0.71 mg/dL (ref 0.40–1.20)
GFR: 117.7 mL/min (ref >60.00)
Glucose, Bld: 119 mg/dL — ABNORMAL HIGH (ref 70–99)
Potassium: 4.1 mEq/L (ref 3.5–5.1)
Sodium: 140 mEq/L (ref 135–145)
Total Bilirubin: 0.3 mg/dL (ref 0.2–1.2)
Total Protein: 6.9 g/dL (ref 6.0–8.3)

## 2021-06-01 LAB — TSH: TSH: 7.8 u[IU]/mL — ABNORMAL HIGH (ref 0.35–5.50)

## 2021-06-01 LAB — LIPID PANEL
Cholesterol: 187 mg/dL (ref 0–200)
HDL: 46.4 mg/dL (ref >39.00)
NonHDL: 140.98
Total CHOL/HDL Ratio: 4
Triglycerides: 234 mg/dL — ABNORMAL HIGH (ref 0.0–149.0)
VLDL: 46.8 mg/dL — ABNORMAL HIGH (ref 0.0–40.0)

## 2021-06-01 LAB — CBC
HCT: 43.4 % (ref 36.0–46.0)
Hemoglobin: 14.2 g/dL (ref 12.0–15.0)
MCHC: 32.7 g/dL (ref 30.0–36.0)
MCV: 88.5 fl (ref 78.0–100.0)
Platelets: 413 10*3/uL — ABNORMAL HIGH (ref 150.0–400.0)
RBC: 4.9 Mil/uL (ref 3.87–5.11)
RDW: 14.3 % (ref 11.5–15.5)
WBC: 8.4 10*3/uL (ref 4.0–10.5)

## 2021-06-01 LAB — LDL CHOLESTEROL, DIRECT: Direct LDL: 113 mg/dL

## 2021-06-01 LAB — HEMOGLOBIN A1C: Hgb A1c MFr Bld: 5.6 % (ref 4.6–6.5)

## 2021-06-01 LAB — MAGNESIUM: Magnesium: 2.3 mg/dL (ref 1.5–2.5)

## 2021-06-01 NOTE — Assessment & Plan Note (Signed)
Was seen in emergency department diagnosed with this.  Patient still having symptoms but is improving.  We will continue watchful waiting for now.  Patient acknowledged.  Continue to monitor

## 2021-06-01 NOTE — Assessment & Plan Note (Signed)
We did discuss about starting healthy lifestyle choices inclusive of exercise.  Currently patient has no exercise regimen established.  Once she recovers from her acute illness recommended 30 minutes 3 times a day eventually titrating up to 30 minutes 5 times a day.

## 2021-06-01 NOTE — Assessment & Plan Note (Signed)
States that she woke up sometimes with cramps in bilateral lower extremities.  Patient has been taking cramps Aleve over-the-counter that seems to be beneficial.  We will check electrolytes and other labs today.  Continue to monitor

## 2021-06-01 NOTE — Patient Instructions (Signed)
Nice to see you today Work on getting your exercise program going once you are feeling better I will be in touch with labs Follow up 1 year, sooner if needed

## 2021-06-01 NOTE — Progress Notes (Signed)
Established Patient Office Visit  Subjective:  Patient ID: Cynthia Waller, female    DOB: [birth year]  Age: 25 y.o. MRN: [id number]  CC:  Chief Complaint  Patient presents with   Transfer of Care   ER follow up    On left leg/foot pain and numbness. Started having issues with symptoms on 05/25/21 and it kept progressing. She originally started going to chiropractor for back de compressions about 3 weeks ago due to some back pain.    HPI Cynthia Waller presents for complete physical and follow up of chronic conditions.  Immunizations: -Tetanus: 2020   -Influenza: Today -Covid-19: Moderna x2 booster -Shingles: NA -Pneumonia: NA  -HPV: UTD  Diet: Fair diet. Eats 3 times with no snacking. Avoid vegetables Exercise: No regular exercise.   Eye exam: Completed 2 years ago. No corrective lense  Dental exam: Needs updating  Pap Smear: Completed in 2021 Mammogram:  NA Dexa:  NA Colonoscopy: NA  Lung Cancer Screening:  Non smoker Alcohol: Drink a month. Wine is drink of choice.  Back problems: started on Last Monday. Had knee pains after the first one. Another decompression Thursday and woke up Friday with pain and parents found on floor Was seeing a chiropractor. Was seen in the Ed on 05/28/2021 Has had some improvement Numbness and tingling on the left foot that is all the time and no improvement Subjective weakness on bilateral legs.  Past Medical History:  Diagnosis Date   Eczema    Infectious mononucleosis 06/07/2013   PCOS (polycystic ovarian syndrome)    Rash and nonspecific skin eruption 06/07/2013    Past Surgical History:  Procedure Laterality Date   WISDOM TOOTH EXTRACTION  October 7th, 2014    Family History  Problem Relation Age of Onset   Obesity Mother    Diabetes Maternal Grandmother    Diabetes Paternal Grandmother     Social History   Socioeconomic History   Marital status: Married    Spouse name: Not on file   Number of  children: Not on file   Years of education: Not on file   Highest education level: Not on file  Occupational History   Not on file  Tobacco Use   Smoking status: Never   Smokeless tobacco: Never  Vaping Use   Vaping Use: Former   Quit date: 01/28/2017   Devices: jule  Substance and Sexual Activity   Alcohol use: No    Alcohol/week: 0.0 standard drinks    Comment: social   Drug use: No   Sexual activity: Yes    Birth control/protection: Other-see comments    Comment: Plan B  Other Topics Concern   Not on file  Social History Narrative   Currently a junior in college; studying business and accounting. Is sexually active without condoms. Has had new partner since last STI check. No vaginal discharge, pain with sex, or STI symptoms.  Does endorse alcohol use 1-2x per month without the intention of drinking to get drunk. No current or past drug use.      Social Determinants of Health   Financial Resource Strain: Not on file  Food Insecurity: Not on file  Transportation Needs: Not on file  Physical Activity: Not on file  Stress: Not on file  Social Connections: Not on file  Intimate Partner Violence: Not on file    Outpatient Medications Prior to Visit  Medication Sig Dispense Refill   Cholecalciferol (VITAMIN D3) 1.25 MG (50000 UT) TABS Take 1 tablet by mouth every  7 (seven) days. 12 tablet 3   HYDROcodone-acetaminophen (NORCO) 5-325 MG tablet Take 1-2 tablets by mouth every 6 (six) hours as needed. 15 tablet 0   norelgestromin-ethinyl estradiol [facility]) 150-35 MCG/24HR transdermal patch Place 1 patch onto the skin once a week. 9 patch 4   predniSONE (DELTASONE) 20 MG tablet 3 Tabs PO Days 1-3, then 2 tabs PO Days 4-6, then 1 tab PO Day 7-9, then Half Tab PO Day 10-12 20 tablet 0   No facility-administered medications prior to visit.    Allergies  Allergen Reactions   Penicillins Swelling and Rash   Amoxicillin Swelling and Rash    ROS Review of Systems   Constitutional:  Negative for chills and fever.  Respiratory:  Negative for cough and shortness of breath.   Cardiovascular:  Negative for chest pain and leg swelling.  Gastrointestinal:  Negative for diarrhea, nausea and vomiting.  Genitourinary:        No Bowel or Bladder involvement   Musculoskeletal:  Positive for arthralgias and gait problem.  Skin:  Negative for color change and pallor.  Neurological:  Positive for weakness (subjective) and numbness. Negative for headaches.     Objective:    Physical Exam Vitals and nursing note reviewed.  Constitutional:      Appearance: She is obese.  HENT:     Right Ear: Tympanic membrane, ear canal and external ear normal. There is no impacted cerumen.     Left Ear: Tympanic membrane, ear canal and external ear normal. There is no impacted cerumen.     Mouth/Throat:     Mouth: Mucous membranes are moist.     Pharynx: Oropharynx is clear.  Eyes:     Extraocular Movements: Extraocular movements intact.     Pupils: Pupils are equal, round, and reactive to light.  Neck:     Thyroid: No thyroid mass, thyromegaly or thyroid tenderness.  Cardiovascular:     Rate and Rhythm: Normal rate and regular rhythm.     Pulses:          Dorsalis pedis pulses are 2+ on the right side and 2+ on the left side.       Posterior tibial pulses are 1+ on the right side and 1+ on the left side.  Pulmonary:     Effort: Pulmonary effort is normal.     Breath sounds: Normal breath sounds.  Abdominal:     General: Bowel sounds are normal. There is no distension.     Palpations: There is no mass.     Tenderness: There is no abdominal tenderness.  Musculoskeletal:     Thoracic back: No tenderness or bony tenderness.     Lumbar back: No swelling, spasms, tenderness or bony tenderness. Positive left straight leg raise test. Negative right straight leg raise test.     Right hip: Normal.     Left hip: Normal.     Right knee: Tenderness present over the LCL.      Right lower leg: No edema.     Left lower leg: No edema.  Lymphadenopathy:     Cervical: No cervical adenopathy.  Skin:    General: Skin is warm.  Neurological:     Mental Status: She is alert.     Motor: No weakness.     Deep Tendon Reflexes:     Reflex Scores:      Bicep reflexes are 2+ on the right side and 2+ on the left side.      Patellar  reflexes are 2+ on the right side and 2+ on the left side.    Comments: Bilateral upper and lower extremity strength 5/5   Psychiatric:        Mood and Affect: Mood normal.        Behavior: Behavior normal.        Thought Content: Thought content normal.        Judgment: Judgment normal.    BP (!) 138/96   Pulse (!) 110   Temp 97.7 F (36.5 C)   Resp 10   Ht 4' 11.5" (1.511 m)   Wt 231 lb 2 oz (104.8 kg)   LMP 05/02/2021   SpO2 97%   BMI 45.90 kg/m  Wt Readings from Last 3 Encounters:  06/01/21 231 lb 2 oz (104.8 kg)  05/28/21 225 lb (102.1 kg)  01/29/20 223 lb 6.4 oz (101.3 kg)     Health Maintenance Due  Topic Date Due   HIV Screening  Never done   Hepatitis C Screening  Never done   COVID-19 Vaccine (3 - Booster for Moderna series) 02/23/2020   INFLUENZA VACCINE  03/22/2021    There are no preventive care reminders to display for this patient.  Lab Results  Component Value Date   TSH 2.52 01/29/2020   Lab Results  Component Value Date   WBC 6.6 05/27/2016   HGB 11.8 05/27/2016   HCT 37.2 05/27/2016   MCV 74.7 (L) 05/27/2016   PLT 488 (H) 05/27/2016   Lab Results  Component Value Date   NA 138 05/27/2016   K 4.3 05/27/2016   CO2 25 05/27/2016   GLUCOSE 102 (H) 05/27/2016   BUN 6 (L) 05/27/2016   CREATININE 0.69 05/27/2016   BILITOT 0.3 05/27/2016   ALKPHOS 67 05/27/2016   AST 11 05/27/2016   ALT 6 05/27/2016   PROT 6.9 05/27/2016   ALBUMIN 3.9 05/27/2016   CALCIUM 9.5 05/27/2016   Lab Results  Component Value Date   CHOL 192 01/29/2020   Lab Results  Component Value Date   HDL 38.10 (L)  01/29/2020   Lab Results  Component Value Date   LDLCALC 121 (H) 05/27/2016   Lab Results  Component Value Date   TRIG 230.0 (H) 01/29/2020   Lab Results  Component Value Date   CHOLHDL 5 01/29/2020   Lab Results  Component Value Date   HGBA1C 5.6 01/29/2020      Assessment & Plan:   Problem List Items Addressed This Visit       Endocrine   Goiter    Had an ultrasound in 2019.  And no suspicious lesions or nodules that require biopsy nor follow-up ultrasound per radiology report.        Other   Class 3 severe obesity due to excess calories without serious comorbidity with body mass index (BMI) of 45.0 to 49.9 in adult [facility])    We did discuss about starting healthy lifestyle choices inclusive of exercise.  Currently patient has no exercise regimen established.  Once she recovers from her acute illness recommended 30 minutes 3 times a day eventually titrating up to 30 minutes 5 times a day.      Relevant Orders   Hemoglobin A1c   Lipid panel   Left-sided low back pain without sciatica    Was seen in emergency department diagnosed with this.  Patient still having symptoms but is improving.  We will continue watchful waiting for now.  Patient acknowledged.  Continue to monitor  Paresthesia of left foot   Relevant Orders   Magnesium   Cramps of lower extremity    States that she woke up sometimes with cramps in bilateral lower extremities.  Patient has been taking cramps Aleve over-the-counter that seems to be beneficial.  We will check electrolytes and other labs today.  Continue to monitor      Relevant Orders   Magnesium   Preventative health care - Primary    Reviewed preventative health care and immunizations with patient.  Encouraged healthy lifestyle      Relevant Orders   CBC   Comprehensive metabolic panel   Hemoglobin A1c   TSH   Lipid panel   Other Visit Diagnoses     Need for influenza vaccination       Relevant Orders   Flu Vaccine QUAD  6+ mos PF IM (Fluarix Quad PF) (Completed)       No orders of the defined types were placed in this encounter.   Follow-up: Return in about 1 year (around 06/01/2022) for CPE and labs.   This visit occurred during the SARS-CoV-2 public health emergency.  Safety protocols were in place, including screening questions prior to the visit, additional usage of staff PPE, and extensive cleaning of exam room while observing appropriate contact time as indicated for disinfecting solutions.   Audria Nine, NP

## 2021-06-01 NOTE — Assessment & Plan Note (Signed)
Reviewed preventative health care and immunizations with patient.  Encouraged healthy lifestyle

## 2021-06-01 NOTE — Assessment & Plan Note (Signed)
Had an ultrasound in 2019.  And no suspicious lesions or nodules that require biopsy nor follow-up ultrasound per radiology report.

## 2021-06-02 ENCOUNTER — Encounter: Payer: Self-pay | Admitting: Nurse Practitioner

## 2021-06-02 ENCOUNTER — Other Ambulatory Visit (INDEPENDENT_AMBULATORY_CARE_PROVIDER_SITE_OTHER): Payer: 59

## 2021-06-02 ENCOUNTER — Other Ambulatory Visit: Payer: Self-pay | Admitting: Nurse Practitioner

## 2021-06-02 DIAGNOSIS — R946 Abnormal results of thyroid function studies: Secondary | ICD-10-CM | POA: Diagnosis not present

## 2021-06-02 DIAGNOSIS — R7989 Other specified abnormal findings of blood chemistry: Secondary | ICD-10-CM

## 2021-06-02 LAB — T4, FREE: Free T4: 0.91 ng/dL (ref 0.60–1.60)

## 2021-06-03 ENCOUNTER — Other Ambulatory Visit: Payer: Self-pay | Admitting: Nurse Practitioner

## 2021-06-03 DIAGNOSIS — M545 Low back pain, unspecified: Secondary | ICD-10-CM

## 2021-06-03 MED ORDER — HYDROCODONE-ACETAMINOPHEN 5-325 MG PO TABS: 1.0000 | ORAL_TABLET | Freq: Four times a day (QID) | ORAL | 0 refills | Status: DC | PRN

## 2021-06-07 ENCOUNTER — Other Ambulatory Visit: Payer: Self-pay

## 2021-06-07 ENCOUNTER — Ambulatory Visit (INDEPENDENT_AMBULATORY_CARE_PROVIDER_SITE_OTHER): Payer: 59 | Admitting: Family Medicine

## 2021-06-07 ENCOUNTER — Encounter: Payer: Self-pay | Admitting: Family Medicine

## 2021-06-07 ENCOUNTER — Ambulatory Visit (INDEPENDENT_AMBULATORY_CARE_PROVIDER_SITE_OTHER)
Admission: RE | Admit: 2021-06-07 | Discharge: 2021-06-07 | Disposition: A | Discharge status: Home / Self Care | Payer: 59 | Source: Ambulatory Visit | Attending: Family Medicine | Admitting: Family Medicine

## 2021-06-07 VITALS — BP 110/78 | HR 101 | Temp 97.7°F | Ht 59.5 in | Wt 225.1 lb

## 2021-06-07 DIAGNOSIS — M5416 Radiculopathy, lumbar region: Secondary | ICD-10-CM | POA: Diagnosis not present

## 2021-06-07 DIAGNOSIS — M21371 Foot drop, right foot: Secondary | ICD-10-CM

## 2021-06-07 DIAGNOSIS — R29898 Other symptoms and signs involving the musculoskeletal system: Secondary | ICD-10-CM | POA: Diagnosis not present

## 2021-06-07 DIAGNOSIS — M21372 Foot drop, left foot: Secondary | ICD-10-CM

## 2021-06-07 DIAGNOSIS — R2689 Other abnormalities of gait and mobility: Secondary | ICD-10-CM

## 2021-06-07 DIAGNOSIS — R200 Anesthesia of skin: Secondary | ICD-10-CM | POA: Diagnosis not present

## 2021-06-07 DIAGNOSIS — M4800 Spinal stenosis, site unspecified: Secondary | ICD-10-CM

## 2021-06-07 DIAGNOSIS — M5127 Other intervertebral disc displacement, lumbosacral region: Secondary | ICD-10-CM | POA: Diagnosis not present

## 2021-06-07 DIAGNOSIS — M545 Low back pain, unspecified: Secondary | ICD-10-CM

## 2021-06-07 MED ORDER — CYCLOBENZAPRINE HCL 10 MG PO TABS: 5.0000 mg | ORAL_TABLET | Freq: Every evening | ORAL | 1 refills | Status: AC | PRN

## 2021-06-07 MED ORDER — HYDROCODONE-ACETAMINOPHEN 5-325 MG PO TABS: 1.0000 | ORAL_TABLET | Freq: Four times a day (QID) | ORAL | 0 refills | Status: AC | PRN

## 2021-06-07 MED ORDER — PREDNISONE 20 MG PO TABS: ORAL_TABLET | ORAL | 0 refills | Status: DC

## 2021-06-07 NOTE — Progress Notes (Addendum)
Cynthia Waller T. Isaic Syler, MD, CAQ Sports Medicine Cynthia Waller at [facility] [address] [place], [address]  Phone: [phone]  FAX: [phone]  Cynthia Waller - 25 y.o. female  MRN [id number]  Date of Birth: August 15, 1996  Date: 06/07/2021  PCP: Cynthia Emms, NP  Referral: Cynthia Emms, NP  Chief Complaint  Patient presents with  . Sciatica    Left Side-Seen in ED 05/28/2021    This visit occurred during the SARS-CoV-2 public health emergency.  Safety protocols were in place, including screening questions prior to the visit, additional usage of staff PPE, and extensive cleaning of exam room while observing appropriate contact time as indicated for disinfecting solutions.   Subjective:   Cynthia Waller is a 25 y.o. very pleasant female patient with Body mass index is 44.71 kg/m. who presents with the following:  L foot pain and numbess, onset 05/25/2021.  10/7 ER visit, given IM and oral steroids. Mr. Cynthia Waller recently gave some Norco, as well.  She continues to do quite poorly, and she is having a lot of difficulty ambulating, now she is using a cane.  Globally, she is a fairly healthy young lady.  She went to chiropractor before this.  She had been having some relatively chronic back pain, but this abruptly started on May 25, 2021.  At that point she had acute pain and acute radicular pain as well as some numbness and strength changing.  Walking is not normal.  Lateral leg dome not feel normal.  Whole buttocks area will hurt a lot.  All of her legs will hurt.  This is bilateral pain.  No acute onset of pain initially, but then there was an acute onset of pain.  Went to [employer]. She went tinto traction before then.  Did some etim and did some PT modalities with manipulation.   Then left this office she reports that she was completely numb.  Cynthia Waller younger and college, had sciatica.  This responded to some conservative  measures including injectable steroids as well as oral steroids.  Now she has weakness in both legs, predominantly more on the left, and she also has some tingling get loss of sensation.  No bowel or bladder incontinence.  No saddle anesthesia.  She does have some x-rays on her cell phone obtained via picture, but is difficult for me to tell much through them.  Str: b toe ext very poor to nonexistence ? Foot drop on walking, decreased standing on toesme sch soft Sens:  medial l foot L dorsum is decreased 1st webspace on the L  Fine L medial and lateral foot decr L 1st webspace decreased by a lot    Review of Systems is noted in the HPI, as appropriate   Patient Active Problem List   Diagnosis Date Noted  . Paresthesia of left foot 06/01/2021  . Cramps of lower extremity 06/01/2021  . Preventative health care 06/01/2021  . Enlarged uterus 12/28/2015  . Vaginal bleeding 10/02/2015  . Screening for iron deficiency anemia 10/02/2015  . Left-sided low back pain without sciatica 05/27/2015  . Vitamin D deficiency 01/15/2015  . Goiter 11/01/2013  . Polycystic ovarian syndrome 01/22/2013  . Class 3 severe obesity due to excess calories without serious comorbidity with body mass index (BMI) of 45.0 to 49.9 in adult (HCC) 01/22/2013  . Acanthosis nigricans 01/22/2013    Past Medical History:  Diagnosis Date  . Eczema   . Infectious mononucleosis 06/07/2013  . PCOS (  polycystic ovarian syndrome)   . Rash and nonspecific skin eruption 06/07/2013    Past Surgical History:  Procedure Laterality Date  . WISDOM TOOTH EXTRACTION  October 7th, 2014    Family History  Problem Relation Age of Onset  . Obesity Mother   . Diabetes Maternal Grandmother   . Diabetes Paternal Grandmother      Objective:   BP 110/78   Pulse (!) 101   Temp 97.7 F (36.5 C) (Temporal)   Ht 4' 11.5" (1.511 m)   Wt 225 lb 2 oz (102.1 kg)   LMP 06/01/2021   SpO2 98%   BMI 44.71 kg/m      Range of motion at  the waist: Flexion, extension, lateral bending and rotation:  Inability to forward flex whatsoever, extension causes pain, lateral bending is more preserved as well as rotational being more preserved, but is still abnormal.  No echymosis or edema Rises to examination table with mild difficulty Gait: Decidedly abnormal, Trendelenburg gait with use of cane.  Inspection/Deformity: N Paraspinus Tenderness: l3-s1 b   B Ankle Dorsiflexion (L5,4): 4/5 on the right and 4 -/5 on the left B Great Toe Dorsiflexion (L5,4): I am unable to have the great toe move whatsoever on first digit, she is minimally able to move with a gross strength of 2-/5  heel Walk (L5): Unable to complete, foot drop Toe Walk (S1): Unable to complete Rise/Squat (L4): WNL, mild pain  SENSORY B Medial Foot (L4): Decreased on the left, fine touch as well as light touch and pinprick B Dorsum (L5): WNL B Lateral (S1): Decreased on the left, light touch and pinprick First dorsal web space is decreased on the left Light Touch: WNL Pinprick: WNL  REFLEXES Knee (L4): 2+ Ankle (S1): 2+  B SLR, seated: Positive on the left B SLR, supine: Positive on the left B FABER: neg B Reverse FABER: neg B Greater Troch: NT B Log Roll: neg B Sciatic Notch: NT   Radiology: DG Lumbar Spine Complete  Result Date: 06/08/2021 CLINICAL DATA:  Lower extremity radiculopathy. EXAM: LUMBAR SPINE - COMPLETE 4+ VIEW COMPARISON:  None. FINDINGS: Normal alignment of the lumbar vertebral bodies. Mild degenerative disc disease noted at L4-5 and L5-S1. No acute bony findings or destructive bony changes. The facets are normally aligned. No pars defects. The visualized bony pelvis is intact. The SI joints appear normal. IMPRESSION: Mild degenerative disc disease at L4-5 and L5-S1. No acute bony findings. Electronically Signed   By: Rudie Meyer M.D.   On: 06/08/2021 14:59   MR Lumbar Spine Wo Contrast  Result Date:  06/21/2021 CLINICAL DATA:  Low back pain, progressive neurologic deficit foot drop, absent great toe function, extensive numbness and weakness EXAM: MRI LUMBAR SPINE WITHOUT CONTRAST TECHNIQUE: Multiplanar, multisequence MR imaging of the lumbar spine was performed. No intravenous contrast was administered. COMPARISON:  Lumbar radiographs June 07, 2021. FINDINGS: Segmentation: Standard segmentation is assumed. The inferior-most fully formed intervertebral disc is labeled L5-S1. Alignment:  Straightening.  No substantial sagittal subluxation. Vertebrae: Degenerative/discogenic endplate signal changes about the L4-L5 and L5-S1 disc spaces. Otherwise, no focal marrow edema to suggest acute fracture or discitis/osteomyelitis. No suspicious bone lesions. Conus medullaris and cauda equina: Conus extends to the L1-L2 level. Conus and cauda equina appear normal. Paraspinal and other soft tissues: Unremarkable. Disc levels: T12-L1: No significant disc protrusion, foraminal stenosis, or canal stenosis. L1-L2: No significant disc protrusion, foraminal stenosis, or canal stenosis. L2-L3: No significant disc protrusion, foraminal stenosis, or canal stenosis.  L3-L4: Mild disc height loss and desiccation. Broad disc bulge with superimposed central disc protrusion with annular fissure. Resulting mild canal and right subarticular recess stenosis without significant foraminal stenosis. L4-L5: Moderate disc height loss and desiccation. Broad disc bulge with large superiorly dissecting disc protrusion. Resulting severe canal stenosis. No significant foraminal stenosis. L5-S1: Moderate disc height loss and desiccation. Broad disc bulge with bilateral facet arthropathy. Resulting mild bilateral foraminal stenosis without significant canal stenosis. IMPRESSION: 1. At L4-L5, large superiorly dissecting disc protrusion with severe canal stenosis. 2. At L5-S1, mild bilateral foraminal stenosis. 3. At L3-L4, mild canal stenosis.  Electronically Signed   By: Feliberto Harts M.D.   On: 06/21/2021 08:09    Assessment and Plan:     ICD-10-CM   1. Lumbar radiculopathy, acute  M54.16 DG Lumbar Spine Complete    MR Lumbar Spine Wo Contrast    Ambulatory referral to Neurosurgery    2. Herniated nucleus pulposus of lumbosacral region  M51.27 DG Lumbar Spine Complete    MR Lumbar Spine Wo Contrast    Ambulatory referral to Neurosurgery    3. Bilateral leg numbness  R20.0 DG Lumbar Spine Complete    MR Lumbar Spine Wo Contrast    Ambulatory referral to Neurosurgery    4. Ankle weakness  R29.898 DG Lumbar Spine Complete    MR Lumbar Spine Wo Contrast    Ambulatory referral to Neurosurgery    5. Weakness of foot, left  R29.898 DG Lumbar Spine Complete    MR Lumbar Spine Wo Contrast    Ambulatory referral to Neurosurgery    6. Weakness of foot, right  R29.898 DG Lumbar Spine Complete    MR Lumbar Spine Wo Contrast    Ambulatory referral to Neurosurgery    7. Foot drop, bilateral  M21.371 DG Lumbar Spine Complete   M21.372 MR Lumbar Spine Wo Contrast    Ambulatory referral to Neurosurgery    8. Functional gait abnormality  R26.89 MR Lumbar Spine Wo Contrast    Ambulatory referral to Neurosurgery    9. Acute left-sided low back pain without sciatica  M54.50 HYDROcodone-acetaminophen (NORCO) 5-325 MG tablet    Ambulatory referral to Neurosurgery    10. Central stenosis of spinal canal  M48.00 Ambulatory referral to Neurosurgery     Total encounter time: 40 minutes. This includes total time spent on the day of encounter.  Additional time spent on in-depth neurological exam, explanations regarding anatomy and review with patient, husband, and reviewing plans of care.  Very concerning neurological examination.  This is markedly abnormal compared to the ER notes from May 28, 2021.  Strength was noted at 5/5 in the lower extremities.  At this point, the patient has foot drop, she is not able to walk on her  tiptoes, she has essentially no strength in flexion or extension at the great toe bilaterally.  She also has diffuse numbness to pinprick and soft touch on the lateral lower extremity as well as much of the feet bilaterally.  This is a very concerning exam with progression of neurological symptoms.  Plain x-rays.  Obtain an MRI of the lumbar spine to evaluate for disc herniation with concern for spinal cord edema, spinal cord compromise.  Foraminal impingement also likely.    MRI needed to evaluate for acute neurosurgical risk stratification.  Addendum: 06/21/21 10:22 AM  I just spoke to the patient right now.  Her MRI results have returned, and she has a very significant disc herniation at L4-5 with  some central canal stenosis, and this would clinically make sense with her current ongoing symptoms.  She continues to have some foot drop as well as weakness at the great toe and numbness in the lower extremity.  While this is worse on the left, she is having some bilateral symptoms in general.  We discussed this on the phone, and she needs to have a neurosurgical consultation.  She agrees, we will plan as such.  MR Lumbar Spine Wo Contrast  Result Date: 06/21/2021 CLINICAL DATA:  Low back pain, progressive neurologic deficit foot drop, absent great toe function, extensive numbness and weakness EXAM: MRI LUMBAR SPINE WITHOUT CONTRAST TECHNIQUE: Multiplanar, multisequence MR imaging of the lumbar spine was performed. No intravenous contrast was administered. COMPARISON:  Lumbar radiographs June 07, 2021. FINDINGS: Segmentation: Standard segmentation is assumed. The inferior-most fully formed intervertebral disc is labeled L5-S1. Alignment:  Straightening.  No substantial sagittal subluxation. Vertebrae: Degenerative/discogenic endplate signal changes about the L4-L5 and L5-S1 disc spaces. Otherwise, no focal marrow edema to suggest acute fracture or discitis/osteomyelitis. No suspicious bone lesions.  Conus medullaris and cauda equina: Conus extends to the L1-L2 level. Conus and cauda equina appear normal. Paraspinal and other soft tissues: Unremarkable. Disc levels: T12-L1: No significant disc protrusion, foraminal stenosis, or canal stenosis. L1-L2: No significant disc protrusion, foraminal stenosis, or canal stenosis. L2-L3: No significant disc protrusion, foraminal stenosis, or canal stenosis. L3-L4: Mild disc height loss and desiccation. Broad disc bulge with superimposed central disc protrusion with annular fissure. Resulting mild canal and right subarticular recess stenosis without significant foraminal stenosis. L4-L5: Moderate disc height loss and desiccation. Broad disc bulge with large superiorly dissecting disc protrusion. Resulting severe canal stenosis. No significant foraminal stenosis. L5-S1: Moderate disc height loss and desiccation. Broad disc bulge with bilateral facet arthropathy. Resulting mild bilateral foraminal stenosis without significant canal stenosis. IMPRESSION: 1. At L4-L5, large superiorly dissecting disc protrusion with severe canal stenosis. 2. At L5-S1, mild bilateral foraminal stenosis. 3. At L3-L4, mild canal stenosis. Electronically Signed   By: Feliberto Harts M.D.   On: 06/21/2021 08:09     Meds ordered this encounter  Medications  . predniSONE (DELTASONE) 20 MG tablet    Sig: 2 tabs po for 7 days, then 1 tab po for 7 days    Dispense:  21 tablet    Refill:  0  . cyclobenzaprine (FLEXERIL) 10 MG tablet    Sig: Take 0.5-1 tablets (5-10 mg total) by mouth at bedtime as needed for muscle spasms.    Dispense:  30 tablet    Refill:  1  . HYDROcodone-acetaminophen (NORCO) 5-325 MG tablet    Sig: Take 1 tablet by mouth every 6 (six) hours as needed.    Dispense:  50 tablet    Refill:  0   Medications Discontinued During This Encounter  Medication Reason  . predniSONE (DELTASONE) 20 MG tablet   . HYDROcodone-acetaminophen (NORCO) 5-325 MG tablet Reorder    Orders Placed This Encounter  Procedures  . DG Lumbar Spine Complete  . MR Lumbar Spine Wo Contrast  . Ambulatory referral to Neurosurgery    Follow-up: No follow-ups on file.  [employer] speech-to-text software was used for transcription in this dictation.  Possible transcriptional errors can occur using Animal nutritionist.   Signed,  Elpidio Galea. Orlene Salmons, MD   Outpatient Encounter Medications as of 06/07/2021  Medication Sig  . Cholecalciferol (VITAMIN D3) 1.25 MG (50000 UT) TABS Take 1 tablet by mouth every 7 (seven) days.  Marland Kitchen  cyclobenzaprine (FLEXERIL) 10 MG tablet Take 0.5-1 tablets (5-10 mg total) by mouth at bedtime as needed for muscle spasms.  . norelgestromin-ethinyl estradiol [facility]) 150-35 MCG/24HR transdermal patch Place 1 patch onto the skin once a week.  . predniSONE (DELTASONE) 20 MG tablet 2 tabs po for 7 days, then 1 tab po for 7 days  . [DISCONTINUED] HYDROcodone-acetaminophen (NORCO) 5-325 MG tablet Take 1-2 tablets by mouth every 6 (six) hours as needed.  . [DISCONTINUED] predniSONE (DELTASONE) 20 MG tablet 3 Tabs PO Days 1-3, then 2 tabs PO Days 4-6, then 1 tab PO Day 7-9, then Half Tab PO Day 10-12  . HYDROcodone-acetaminophen (NORCO) 5-325 MG tablet Take 1 tablet by mouth every 6 (six) hours as needed.   No facility-administered encounter medications on file as of 06/07/2021.

## 2021-06-10 NOTE — Telephone Encounter (Signed)
I think that this is still in the prior authorization process.  I am going to let her know this and tell her that she should expect to hear from [place] when that is complete.

## 2021-06-21 ENCOUNTER — Ambulatory Visit
Admission: RE | Admit: 2021-06-21 | Discharge: 2021-06-21 | Disposition: A | Discharge status: Home / Self Care | Payer: 59 | Source: Ambulatory Visit | Attending: Family Medicine | Admitting: Family Medicine

## 2021-06-21 DIAGNOSIS — M21371 Foot drop, right foot: Secondary | ICD-10-CM

## 2021-06-21 DIAGNOSIS — R200 Anesthesia of skin: Secondary | ICD-10-CM

## 2021-06-21 DIAGNOSIS — M5416 Radiculopathy, lumbar region: Secondary | ICD-10-CM

## 2021-06-21 DIAGNOSIS — R29898 Other symptoms and signs involving the musculoskeletal system: Secondary | ICD-10-CM

## 2021-06-21 DIAGNOSIS — M5127 Other intervertebral disc displacement, lumbosacral region: Secondary | ICD-10-CM

## 2021-06-21 DIAGNOSIS — R2689 Other abnormalities of gait and mobility: Secondary | ICD-10-CM

## 2021-06-21 NOTE — Addendum Note (Signed)
Addended by: Hannah Beat on: 06/21/2021 10:23 AM   Modules accepted: Orders

## 2021-06-22 NOTE — Telephone Encounter (Signed)
Pt requesting refill prednisone as this helped her manage her pain

## 2021-06-23 MED ORDER — PREDNISONE 20 MG PO TABS: ORAL_TABLET | ORAL | 0 refills | Status: AC

## 2021-06-24 NOTE — Telephone Encounter (Signed)
Can you help?  I put in the consult 10/31.  I suspect probably a paperwork issue.

## 2021-06-28 ENCOUNTER — Ambulatory Visit: Payer: 59 | Admitting: Family Medicine

## 2021-06-28 NOTE — Telephone Encounter (Signed)
Noted  

## 2021-07-05 ENCOUNTER — Other Ambulatory Visit: Payer: 59

## 2023-08-24 IMAGING — MR MR LUMBAR SPINE W/O CM
4 of 5 series · 26 of 48 positions shown · non-contrast
Comparison: Lumbar radiographs June 07, 2021.

CLINICAL DATA: Low back pain, progressive neurologic deficit foot
drop, absent great toe function, extensive numbness and weakness

EXAM:
MRI LUMBAR SPINE WITHOUT CONTRAST
TECHNIQUE: Multiplanar, multisequence MR imaging of the lumbar spine was
performed. No intravenous contrast was administered.

[Series 3: T2 · sagittal · 4.0mm · 1.09mm/px · 6 of 14 slices shown (1 of 2)]
[im 1/14]
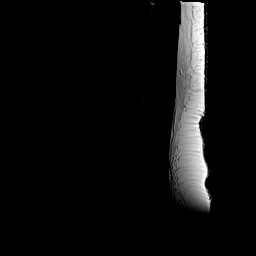
[im 3/14]
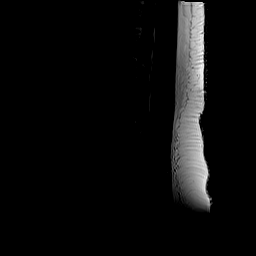
[im 6/14]
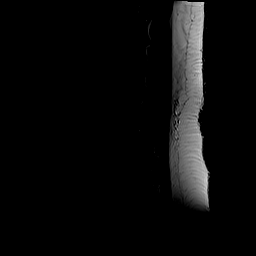
[im 8/14]
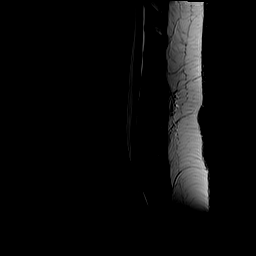
[im 11/14]
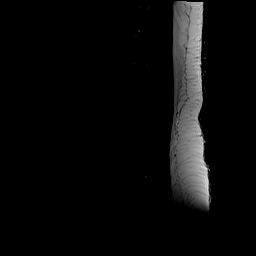
[im 14/14]
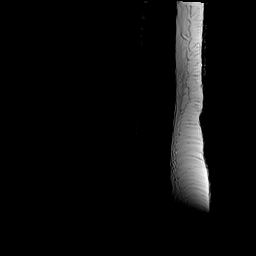

[Series 5: T1 · sagittal · 4.0mm · 1.09mm/px · 5 of 14 slices shown (1 of 2)]
[im 1/14]
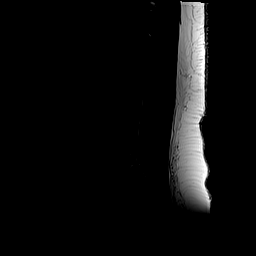
[im 4/14]
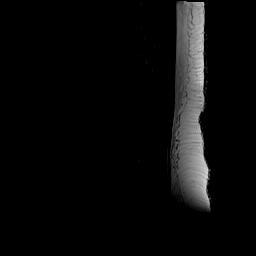
[im 7/14]
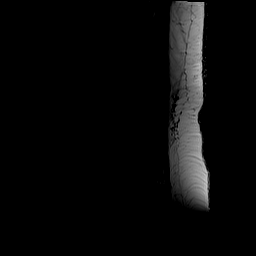
[im 10/14]
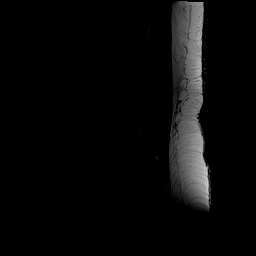
[im 14/14]
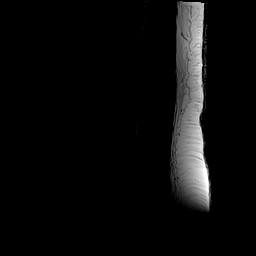

[Series 6: T2 · axial · 4.0mm · 0.39mm/px · z∈[-139,+64]mm · 10 of 41 slices shown (2 of 2)]
[im 3/41]
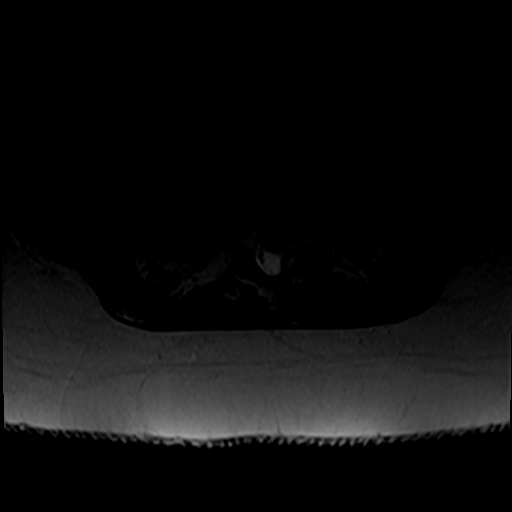
[im 6/41]
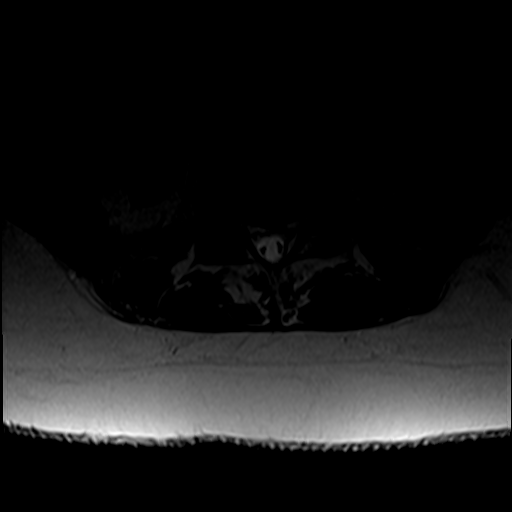
[im 9/41]
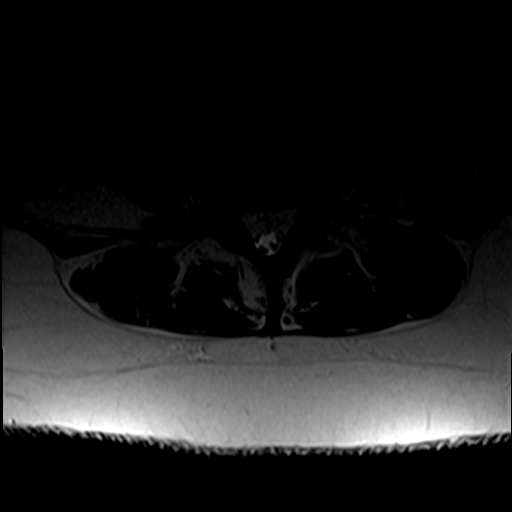
[im 14/41]
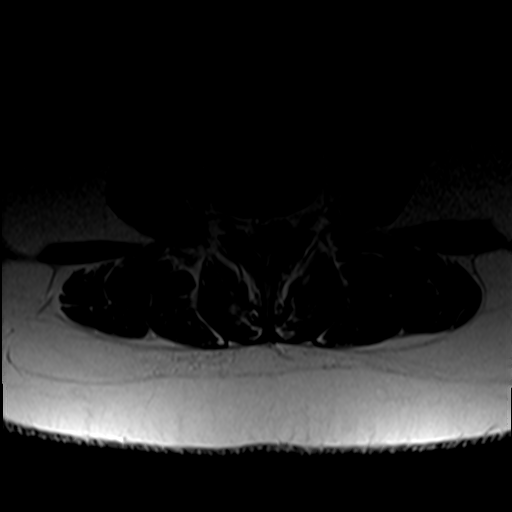
[im 19/41]
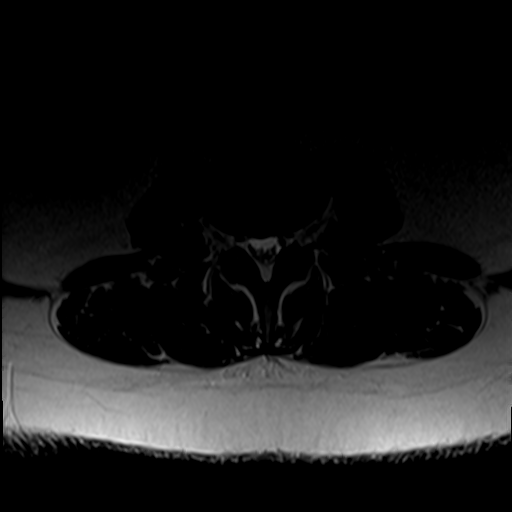
[im 22/41]
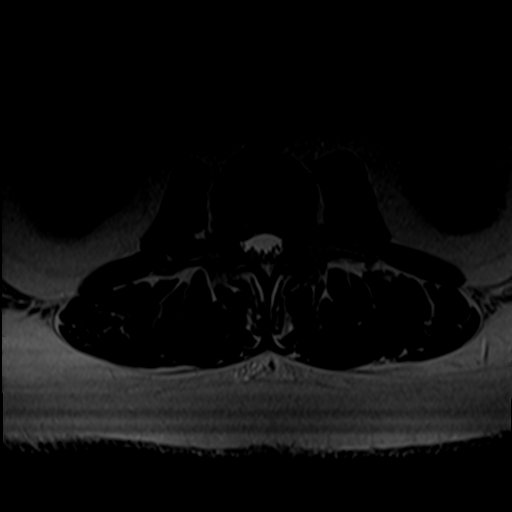
[im 25/41]
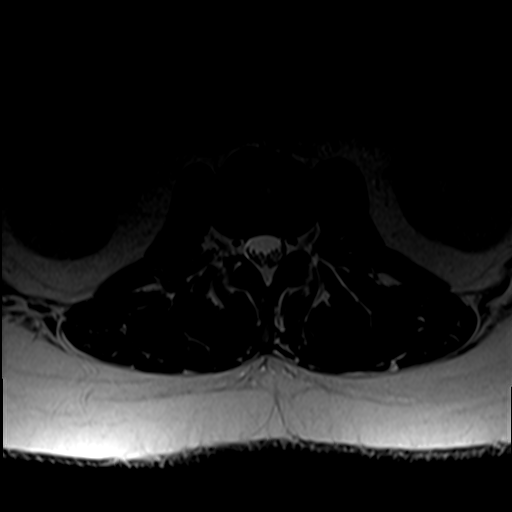
[im 30/41]
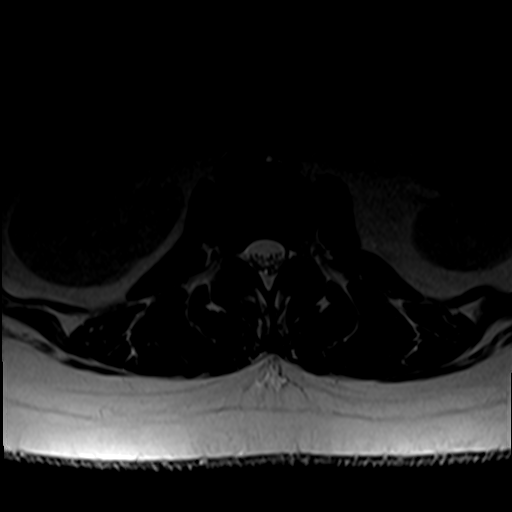
[im 35/41]
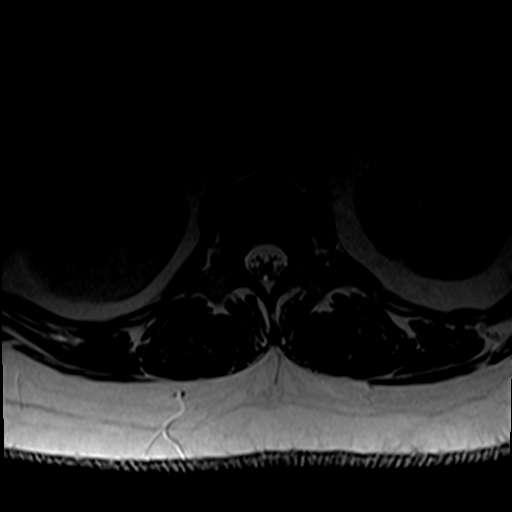
[im 41/41]
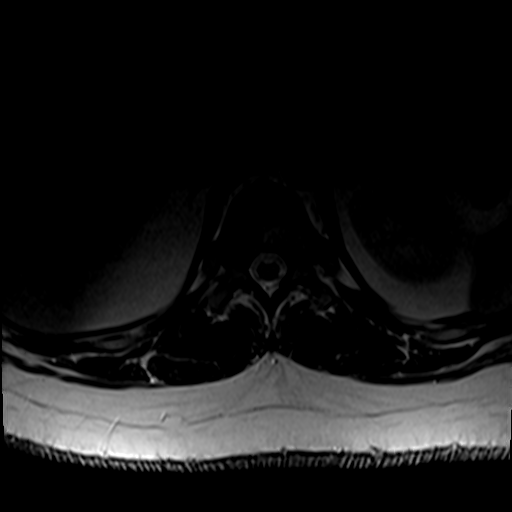

[Series 7: T1 · axial · 4.0mm · 0.39mm/px · z∈[-139,+36]mm · 5 of 41 slices shown (2 of 2)]
[im 3/41]
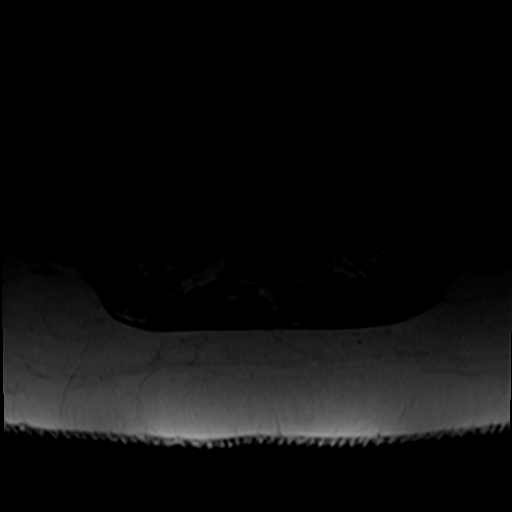
[im 6/41]
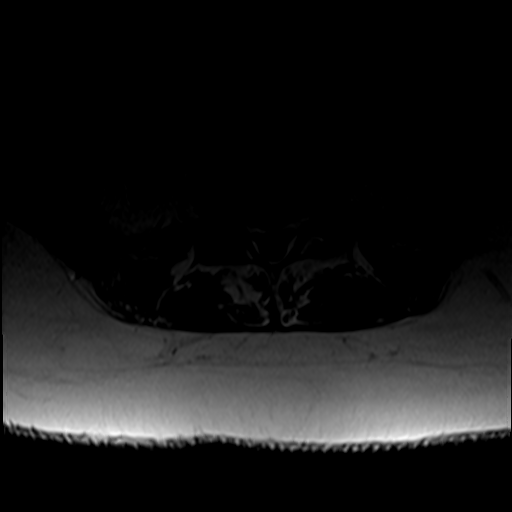
[im 9/41]
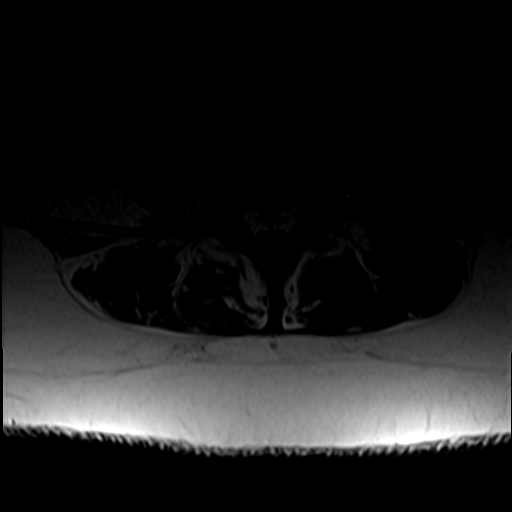
[im 22/41]
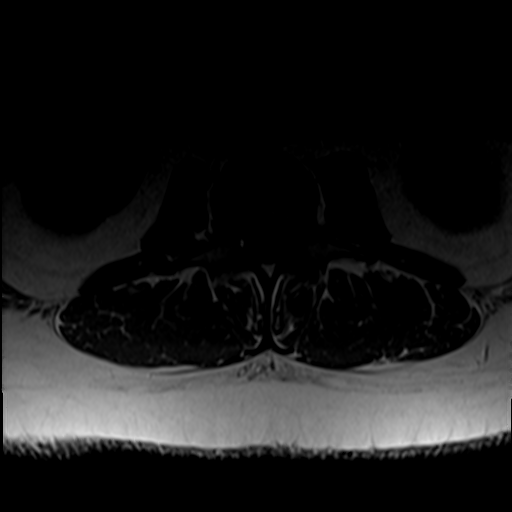
[im 35/41]
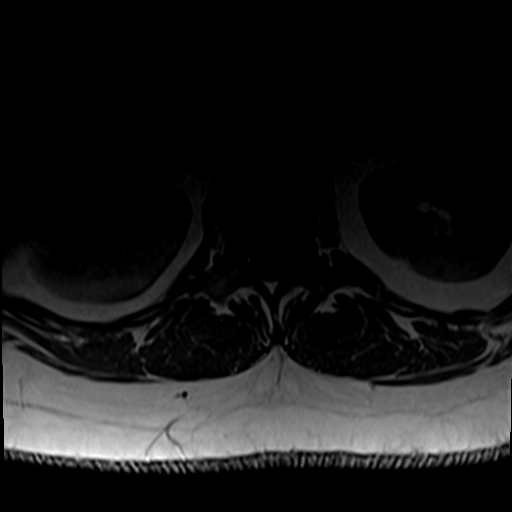

[26 of 48 positions shown; findings below may reference images not displayed]

FINDINGS: Segmentation: Standard segmentation is assumed. The inferior-most
fully formed intervertebral disc is labeled L5-S1.

Alignment:  Straightening.  No substantial sagittal subluxation.

Vertebrae: Degenerative/discogenic endplate signal changes about the
L4-L5 and L5-S1 disc spaces. Otherwise, no focal marrow edema to
suggest acute fracture or discitis/osteomyelitis. No suspicious bone
lesions.

Conus medullaris and cauda equina: Conus extends to the L1-L2 level.
Conus and cauda equina appear normal.

Paraspinal and other soft tissues: Unremarkable.

Disc levels:

T12-L1: No significant disc protrusion, foraminal stenosis, or canal
stenosis.

L1-L2: No significant disc protrusion, foraminal stenosis, or canal
stenosis.

L2-L3: No significant disc protrusion, foraminal stenosis, or canal
stenosis.

L3-L4: Mild disc height loss and desiccation. Broad disc bulge with
superimposed central disc protrusion with annular fissure. Resulting
mild canal and right subarticular recess stenosis without
significant foraminal stenosis.

L4-L5: Moderate disc height loss and desiccation. Broad disc bulge
with large superiorly dissecting disc protrusion. Resulting severe
canal stenosis. No significant foraminal stenosis.

L5-S1: Moderate disc height loss and desiccation. Broad disc bulge
with bilateral facet arthropathy. Resulting mild bilateral foraminal
stenosis without significant canal stenosis.
IMPRESSION: 1. At L4-L5, large superiorly dissecting disc protrusion with severe
canal stenosis.
2. At L5-S1, mild bilateral foraminal stenosis.
3. At L3-L4, mild canal stenosis.
